# Patient Record
Sex: Female | Born: 1935 | Race: Black or African American | Hispanic: No | Smoking: Never smoker
Health system: Southern US, Community
[De-identification: ages and names within clinical notes are randomized; demographics above are authoritative.]

## PROBLEM LIST (undated history)

## (undated) DIAGNOSIS — I10 Essential (primary) hypertension: Secondary | ICD-10-CM

## (undated) DIAGNOSIS — E785 Hyperlipidemia, unspecified: Secondary | ICD-10-CM

## (undated) DIAGNOSIS — H409 Unspecified glaucoma: Secondary | ICD-10-CM

## (undated) DIAGNOSIS — M1990 Unspecified osteoarthritis, unspecified site: Secondary | ICD-10-CM

## (undated) HISTORY — DX: Unspecified osteoarthritis, unspecified site: M19.90

## (undated) HISTORY — DX: Unspecified glaucoma: H40.9

## (undated) HISTORY — DX: Essential (primary) hypertension: I10

## (undated) HISTORY — DX: Hyperlipidemia, unspecified: E78.5

---

## 2000-11-10 ENCOUNTER — Other Ambulatory Visit: Admission: RE | Admit: 2000-11-10 | Discharge: 2000-11-10 | Payer: Self-pay | Admitting: Obstetrics and Gynecology

## 2001-08-09 ENCOUNTER — Ambulatory Visit (HOSPITAL_COMMUNITY): Admission: RE | Admit: 2001-08-09 | Discharge: 2001-08-09 | Payer: Self-pay | Admitting: Cardiology

## 2001-08-09 ENCOUNTER — Encounter: Payer: Self-pay | Admitting: Cardiology

## 2001-10-14 ENCOUNTER — Emergency Department (HOSPITAL_COMMUNITY): Admission: EM | Admit: 2001-10-14 | Discharge: 2001-10-15 | Payer: Self-pay | Admitting: Emergency Medicine

## 2001-10-14 ENCOUNTER — Encounter: Payer: Self-pay | Admitting: Emergency Medicine

## 2001-11-02 ENCOUNTER — Ambulatory Visit (HOSPITAL_COMMUNITY): Admission: RE | Admit: 2001-11-02 | Discharge: 2001-11-02 | Payer: Self-pay | Admitting: Orthopedic Surgery

## 2001-11-12 ENCOUNTER — Encounter: Payer: Self-pay | Admitting: Orthopedic Surgery

## 2001-11-12 ENCOUNTER — Ambulatory Visit (HOSPITAL_COMMUNITY): Admission: RE | Admit: 2001-11-12 | Discharge: 2001-11-12 | Payer: Self-pay | Admitting: Orthopedic Surgery

## 2001-11-22 ENCOUNTER — Encounter: Admission: RE | Admit: 2001-11-22 | Discharge: 2002-02-20 | Payer: Self-pay | Admitting: Orthopedic Surgery

## 2002-01-23 ENCOUNTER — Encounter: Admission: RE | Admit: 2002-01-23 | Discharge: 2002-01-23 | Payer: Self-pay | Admitting: Cardiology

## 2002-01-23 ENCOUNTER — Encounter: Payer: Self-pay | Admitting: Cardiology

## 2004-01-06 ENCOUNTER — Ambulatory Visit: Payer: Self-pay | Admitting: Internal Medicine

## 2004-01-06 ENCOUNTER — Inpatient Hospital Stay (HOSPITAL_COMMUNITY): Admission: EM | Admit: 2004-01-06 | Discharge: 2004-01-16 | Payer: Self-pay | Admitting: Emergency Medicine

## 2004-01-07 ENCOUNTER — Encounter (INDEPENDENT_AMBULATORY_CARE_PROVIDER_SITE_OTHER): Payer: Self-pay | Admitting: *Deleted

## 2004-01-13 ENCOUNTER — Encounter (INDEPENDENT_AMBULATORY_CARE_PROVIDER_SITE_OTHER): Payer: Self-pay | Admitting: *Deleted

## 2004-03-05 ENCOUNTER — Encounter: Admission: RE | Admit: 2004-03-05 | Discharge: 2004-05-28 | Payer: Self-pay | Admitting: Internal Medicine

## 2004-04-07 ENCOUNTER — Encounter: Admission: RE | Admit: 2004-04-07 | Discharge: 2004-04-07 | Payer: Self-pay | Admitting: Cardiology

## 2004-09-22 ENCOUNTER — Ambulatory Visit (HOSPITAL_COMMUNITY): Admission: RE | Admit: 2004-09-22 | Discharge: 2004-09-22 | Payer: Self-pay | Admitting: Cardiology

## 2004-11-24 ENCOUNTER — Encounter: Admission: RE | Admit: 2004-11-24 | Discharge: 2004-11-24 | Payer: Self-pay | Admitting: Cardiology

## 2005-11-22 ENCOUNTER — Ambulatory Visit: Payer: Self-pay | Admitting: Internal Medicine

## 2005-12-08 ENCOUNTER — Ambulatory Visit: Payer: Self-pay | Admitting: Internal Medicine

## 2005-12-20 IMAGING — RF DG ESOPHAGUS
9 of 10 series · 20 of 22 positions shown · non-contrast
Comparison: none

CLINICAL DATA: Dysphagia.
 BARIUM SWALLOW:
 Only a single contrast barium swallow was performed.  The neuromuscular swallowing mechanism appears normal.  There is a prominent cricopharyngeus muscle noted.  Only minimal tertiary contractions are present.  There is a small sliding hiatal hernia but no reflux could be elicited.  Barium pill was given at the end of the study, which passed into the stomach without delay.

[Series 1: run · 1 of 1 slices shown (1 of 9)]
[im 1/1]
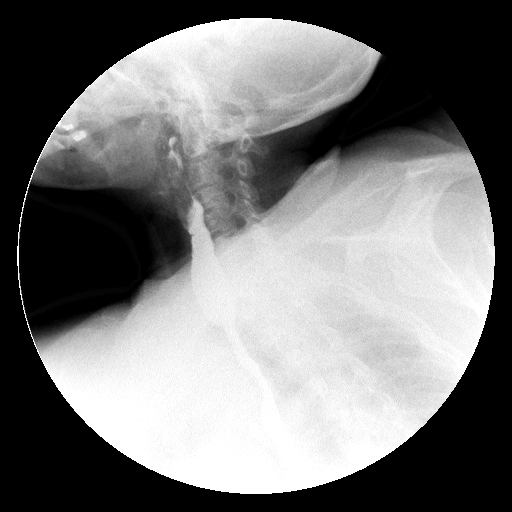

[Series 2: run · 1 of 1 slices shown (2 of 9)]
[im 1/1]
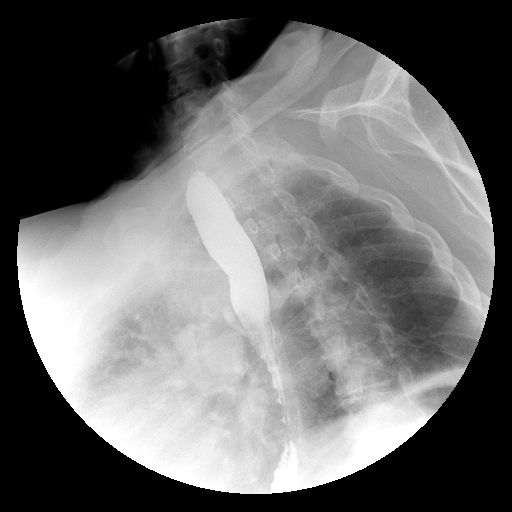

[Series 3: run · 1 of 1 slices shown (3 of 9)]
[im 1/1]
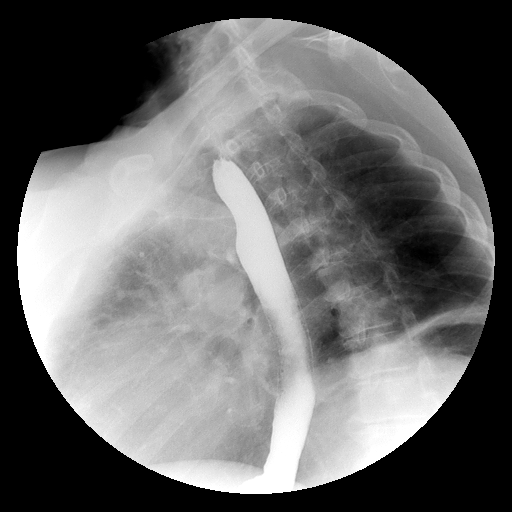

[Series 4: run · 1 of 1 slices shown (4 of 9)]
[im 1/1]
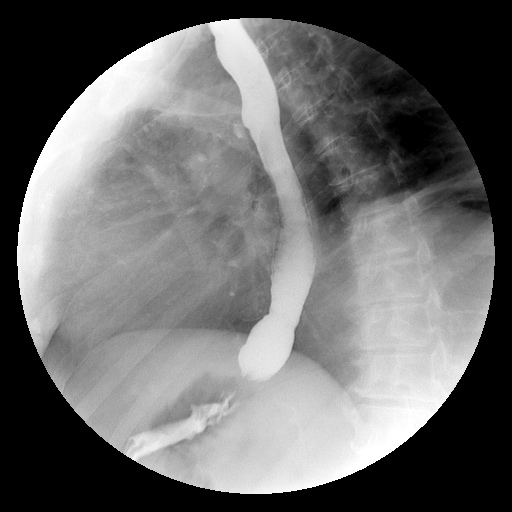

[Series 6: run · 1 of 1 slices shown (5 of 9)]
[im 1/1]
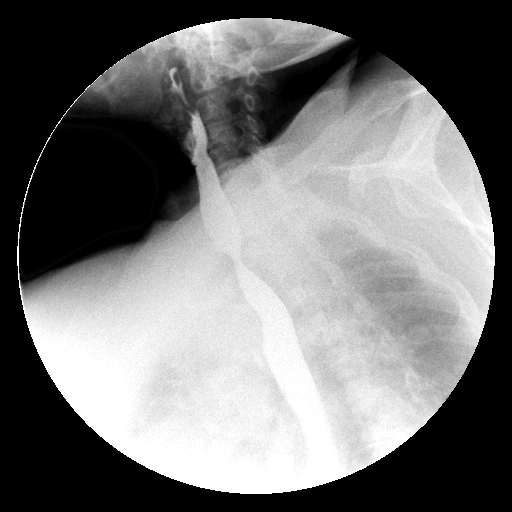

[Series 8: run · 1 of 1 slices shown (6 of 9)]
[im 1/1]
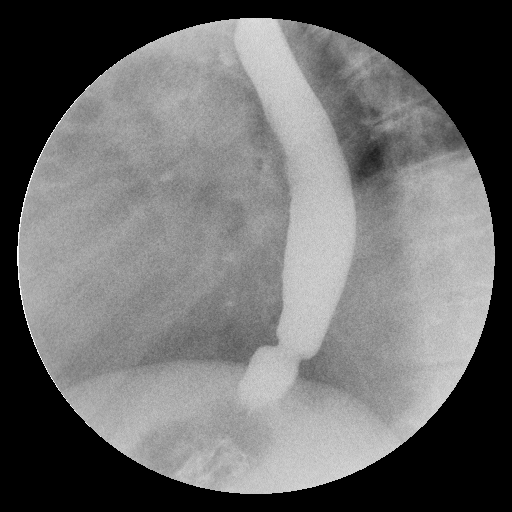

[Series 9: run · 1 of 1 slices shown (7 of 9)]
[im 1/1]
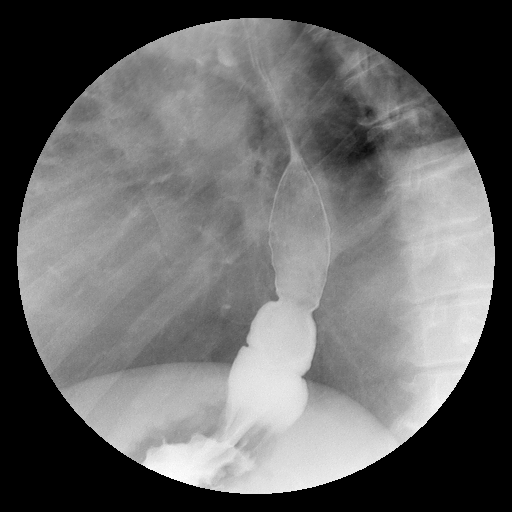

[Series 10: run · 8 of 9 slices shown (8 of 9)]
[im 1/9]
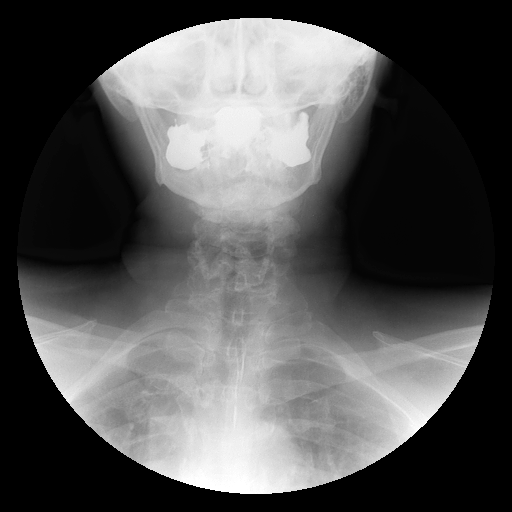
[im 2/9]
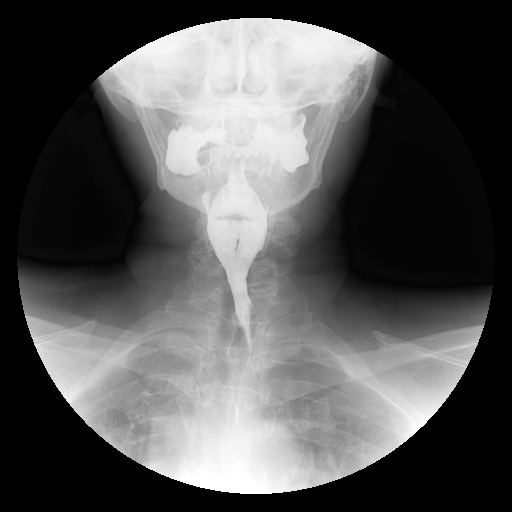
[im 3/9]
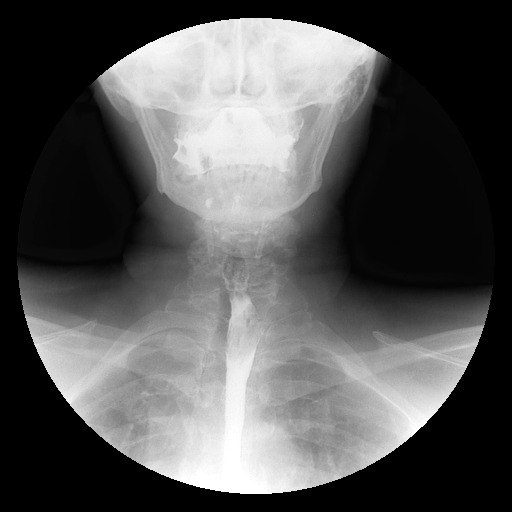
[im 4/9]
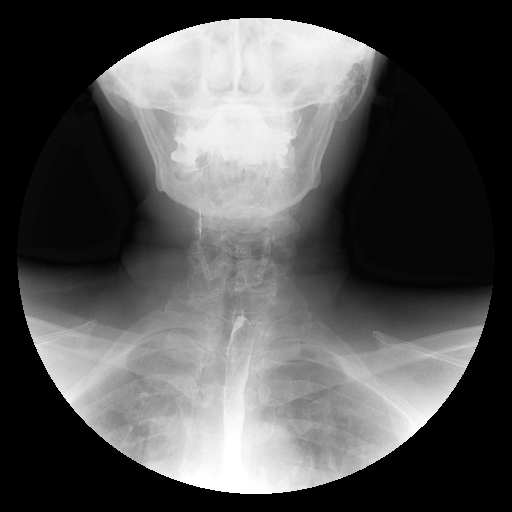
[im 5/9]
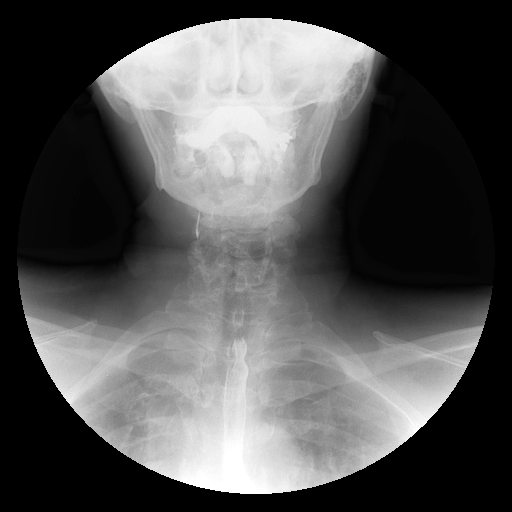
[im 6/9]
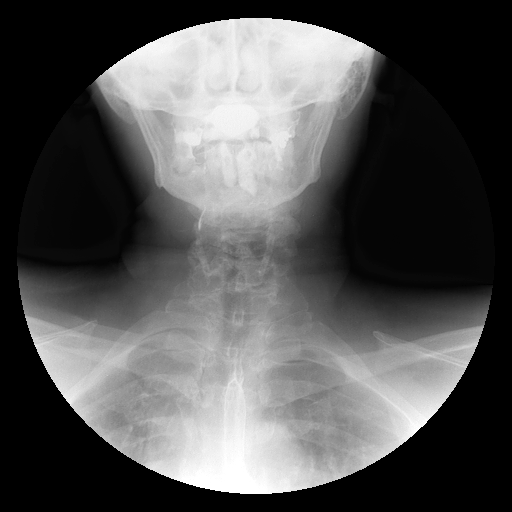
[im 7/9]
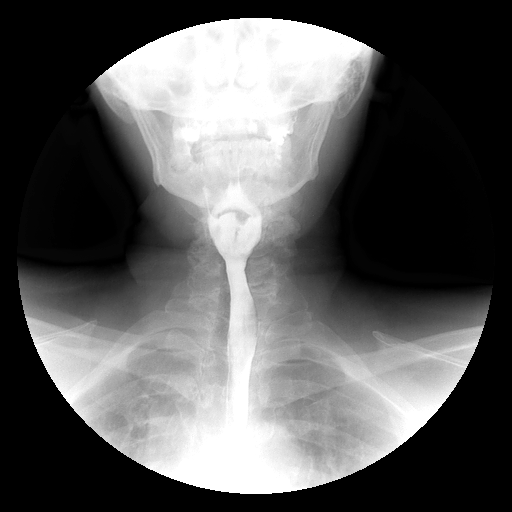
[im 8/9]
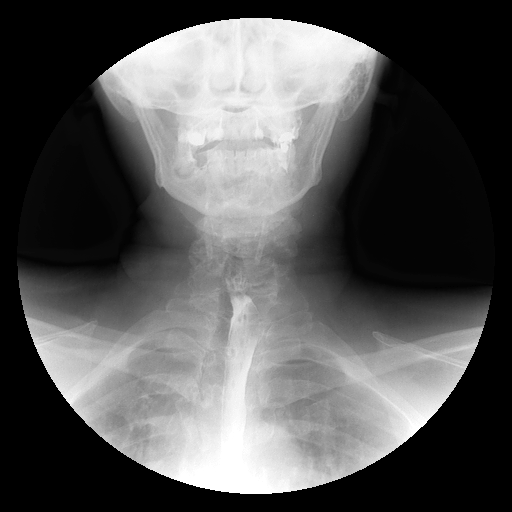

[Series 11: run · 5 of 5 slices shown (9 of 9)]
[im 1/5]
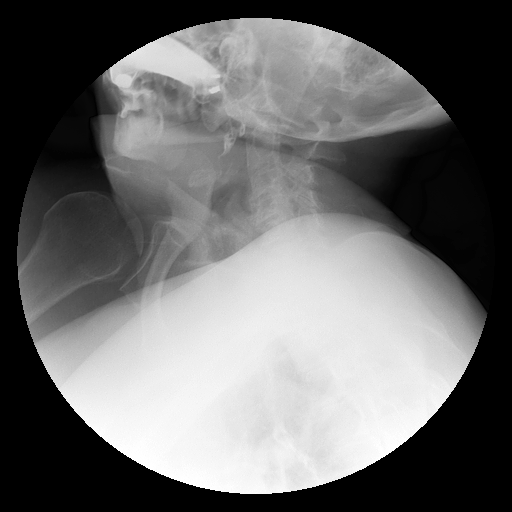
[im 2/5]
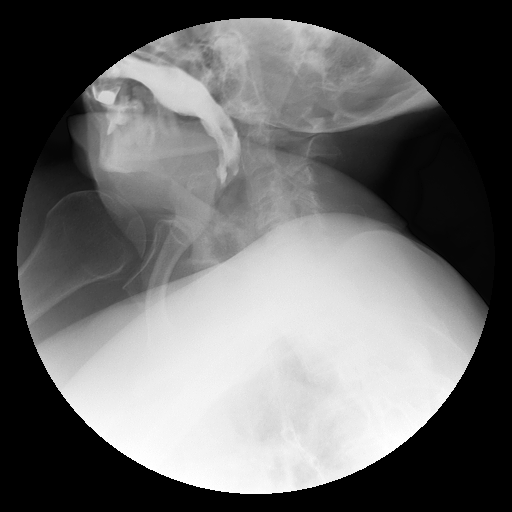
[im 3/5]
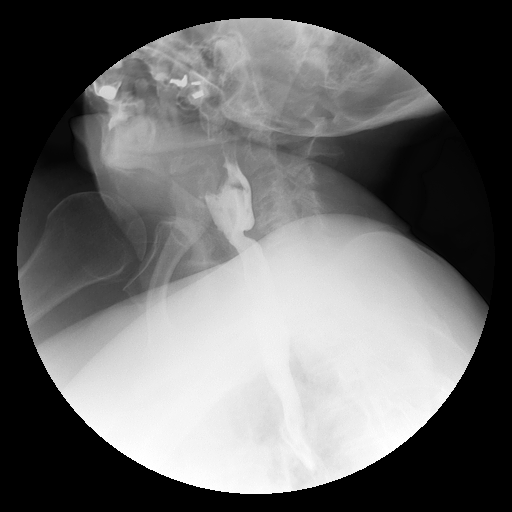
[im 4/5]
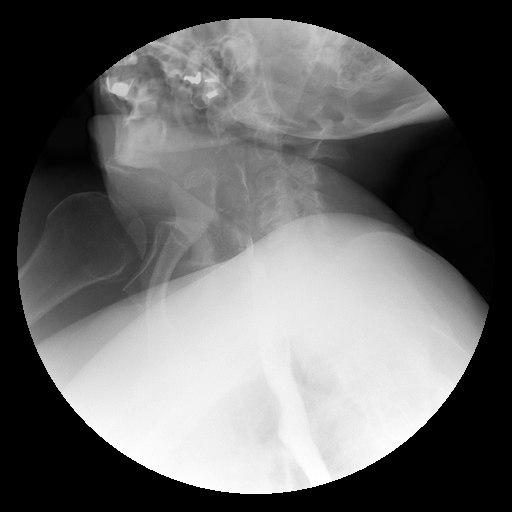
[im 5/5]
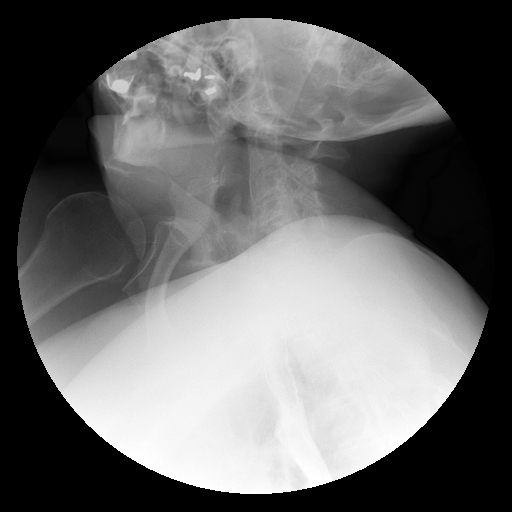

[20 of 22 positions shown; findings below may reference images not displayed]

IMPRESSION: 1.  Small sliding hiatal hernia.  No definite reflux.  Prominent cricopharyngeus muscle.
 2.  Barium pill passes into stomach without delay.

## 2007-05-23 ENCOUNTER — Encounter: Admission: RE | Admit: 2007-05-23 | Discharge: 2007-05-23 | Payer: Self-pay | Admitting: Orthopedic Surgery

## 2007-10-25 ENCOUNTER — Ambulatory Visit (HOSPITAL_COMMUNITY): Admission: RE | Admit: 2007-10-25 | Discharge: 2007-10-25 | Payer: Self-pay | Admitting: Cardiology

## 2008-04-17 ENCOUNTER — Ambulatory Visit (HOSPITAL_COMMUNITY): Admission: RE | Admit: 2008-04-17 | Discharge: 2008-04-17 | Payer: Self-pay | Admitting: Cardiology

## 2008-04-17 ENCOUNTER — Encounter (INDEPENDENT_AMBULATORY_CARE_PROVIDER_SITE_OTHER): Payer: Self-pay | Admitting: Cardiology

## 2008-04-17 ENCOUNTER — Ambulatory Visit: Payer: Self-pay | Admitting: Cardiology

## 2008-09-06 ENCOUNTER — Ambulatory Visit (HOSPITAL_COMMUNITY): Admission: RE | Admit: 2008-09-06 | Discharge: 2008-09-06 | Payer: Self-pay | Admitting: Cardiology

## 2010-02-15 ENCOUNTER — Encounter: Payer: Self-pay | Admitting: Cardiology

## 2010-06-12 NOTE — Consult Note (Signed)
NAMETOVA, VATER NO.:  [id number]   MEDICAL RECORD NO.:  [id number]          PATIENT TYPE:  INP   LOCATION:  4715                         FACILITY:  [facility]   PHYSICIAN:  Wilber Bihari. Caryn Section, M.D.   DATE OF BIRTH:  1929/06/08   DATE OF CONSULTATION:  01/08/2004  DATE OF DISCHARGE:                                   CONSULTATION   Sheila Price is a very nice 77 year old black woman admitted on January 06, 2004, with shortness of breath and right-sided chest pain. Pleural effusions  and pulmonary nodules were noted chest x-ray. CT scan of the chest was done  with contrast on January 06, 2004, at which time serum creatinine was 1.5.  She had been on NSAID (Mobic), A-II blocker (Diovan), diuretic (HCTZ). The  creatinine has risen progressively (4.8 mg/dl today) and renal consultation  was requested. She is currently being treated with Catapres TTS-2 patch once  a week, Zocor 40/D, Toprol 100/D, Norvasc, A-II blocker, NSAID, and HCTZ  have all been discontinued.   PAST MEDICAL HISTORY:  Hypertension, bilateral knee arthroscopy.   ALLERGIES:  None.   REVIEW OF SYSTEMS:  Positive for edema and right-sided chest pain. No  angina, claudication, hemoptysis, hematemesis, melena, hematochezia. No  renal colic. No hematuria. No loss of consciousness. No seizures. No weight  loss. No cold intolerance.   SOCIAL HISTORY:  She was born in [place]. She graduated from high school.  She attended two years at [employer] (studying elementary education).  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. She was  married for 25 years and is a widow. She lives alone. She does not smoke  cigarettes or drink alcohol.   FAMILY HISTORY:  Her father died of ? soon after her birth. Her mother died  in her 76s of old age and high blood pressure. She has three children all of  whom have high blood pressure. They are otherwise well.   PHYSICAL EXAMINATION:  GENERAL:  She is awake, alert.  VITAL SIGNS: Temperature 99, pulse 82, respirations 24, blood pressure  115/70.  HEENT: Moist mucous membranes. No abscess noted in the mouth.  NECK: Carotids +5 without bruits. Not stiff.  CHEST: Decreased breath sounds in the right base.  HEART: No rub.  ABDOMEN: Nontender. Bowel sounds present. No bruits are heard.  EXTREMITIES: No edema.   LABORATORY DATA:  On January 06, 2004, creatinine 1.5; on December 13th  4.3; on December 14th 4.8. Sodium 136, potassium 4.3, chloride 101, CO2 23,  BUN 58. Urinalysis done on December 12th, SG1042, 2+ protein, negative  blood, negative LE, negative NIT, 3-6 wbc's, 0-2 rbc's, occasional hyaline  cast. Hemoglobin 12.4, hematocrit 37.1, WBC 23,600, platelet count 324,000.  Pleural fluid looks exudative (high LDH), cytologies are pending. CT of  abdomen and pelvis do not show any evidence of urinary tract obstruction.   IMPRESSION:  Acute renal failure. Probable contrast nephrotoxicity.   PLAN:  Continue IV fluids for another 24 hours, avoid nephrotoxins, S-PEP  and U-PEP (to be sure no myeloma  is present--doubt she has this also).  No  indications for dialysis at this point.       RFF/MEDQ  D:  01/08/2004  T:  01/08/2004  Job:  [id number]

## 2010-06-12 NOTE — Discharge Summary (Signed)
NAMEAMERY, VANDENBOS NO.:  [id number]   MEDICAL RECORD NO.:  [id number]          PATIENT TYPE:  INP   LOCATION:  4715                         FACILITY:  [facility]   PHYSICIAN:  Osvaldo Shipper. Spruill, M.D.DATE OF BIRTH:  Sep 02, 1929   DATE OF ADMISSION:  01/06/2004  DATE OF DISCHARGE:  01/16/2004                                 DISCHARGE SUMMARY   ADMISSION OR PROVISIONAL DIAGNOSES:  1.  Right pleural effusion.  2.  Severe hypertension.  3.  Questionable diabetes.  4.  Obesity.  5.  Dyslipidemia.  6.  Possible pneumonia.   DISCHARGE DIAGNOSES:  1.  Pneumonia with right pleural effusion.  2.  Lung mass, benign.  3.  Dyslipidemia.  4.  Contrast-induced interstitial nephritis with renal insufficiency.  5.  Obesity.  6.  Hypertension.  7.  Possible left ventricular dysfunction.  8.  Chronic dyspnea.   REASON FOR ADMISSION:  This 77 year old white woman with a history of  hypertension and dyslipidemia presents to the emergency room complaining of  severe dyspnea.  The patient also complained of some severe right-sided  chest pain that had began Friday prior to January 06, 2004.  The patient  sought medical attention at the emergency room, she was evaluated and found  to have evidence of right pleural effusion and possible pneumonia.  Due to  this with complaints of severe chest pain which sounded pleuritic in nature,  she was subsequently admitted to the hospital for further evaluation and  treatment.  The patient's laboratory studies revealed the following:  On  January 09, 2004 white blood cell count was 21,000, hemoglobin 11.5,  hematocrit 33.6.  On December 20 the white blood cell count was 14,600,  hemoglobin 10.4, hematocrit 30.4.  Serum sodium on admission was 133,  potassium 3.6, chloride 100, CO2 content 21, BUN was recorded at 140,  creatinine 59, had decreased to a BUN of 32 and a creatinine of 1.3 at the  time of discharge.   The patient's arterial  blood gases performed on December 17 revealed a pH of  7.416, a pCO2 of 37.1 and a pO2 of 63.5.  The patient's CMET revealed  elevated alkaline phosphatase of 270, SGOT of 70 and SGPT of 76.  The  patient also had a protein electrophoresis which revealed serum albumin was  39.0, alpha-1 13.2, alpha-2 19.6, beta-1 4.9, beta-2 9.0 and gamma globulin  was 14.3.  Additional laboratory results also included admission white blood  cell count 16,500, sedimentation rate on December 12 was 70.  The patient's  initial BUN and creatinine on December 12 was BUN 30, creatinine 1.5,  glucose was 161.  The patient had a biopsy of a mass that was in the left  lung, revealed scattered atypical cell groups in a background of marked  acute inflammation.  No evidence of tumor.  The patient had some fluid  aspirated from her lung on December 13 and was classified as pleural fluid.  It was turbid with increased white cells and increased neutrophil count and  low macrophage.  Blood cultures obtained from the  right hand revealed no  growth after five days and the body fluid aspirate revealed no growth after  three days.   Urinalysis revealed color was yellow, clear.  The specific gravity was  1.040, glucose was negative, pH was 5.5.  The protein in the urine was 100  and hyaline casts were also appreciated.   RADIOLOGIC STUDIES:  The patient initially had a CT scan of the chest with  contrast performed on January 06, 2004.  It revealed several lung nodules,  bilaterally worsened metastatic involvement of the lungs.  A CT scan of the  abdomen and pelvis were requested.  A small right pleural effusion, the  right lower lobe atelectasis and/or pneumonia was noted.  A single prominent  pre carinal node was also noted.  Chest x-ray performed on January 06, 2004  revealed atelectasis, right greater than left, probable small right pleural  effusion.  Ultrasound-guided thoracentesis revealed 60 cc of turbid yellow   fluid.   CT scan of the abdomen performed on January 08, 2004 revealed collapse or  consolidation of both lung bases, right greater than left, associated with  right effusion.  Enhancement of the kidneys two days after contrast bolus  suggested the component of renal dysfunction and a cluster of low density  lesions in the inferior pole of the right kidney thought to be cyst.   Chest x-ray revealed cardiomegaly, mild edema, bilateral basilar atelectasis  was noted.  Chest x-ray performed on December 15 revealed that a PICC line  had been inserted to the proximal superior vena cava.  On December 17,  marginal worsening of air space disease in the right lung.  January 12, 2004 chest x-ray:  Diminished aeration with increase in basilar atelectasis,  no change in the PICC line.  CT-guided aspiration of the right upper lobe  lung lesion was performed on January 13, 2004.   Electrocardiograms revealed a normal sinus rhythm, heart rate was 76, left  ventricular hypertrophy was noted on this particular electrocardiogram.  The  electrocardiographic monitoring revealed the patient had a reported 2.1 sec  pulse and at this point the beta blocker was to continue.   CONSULTATIONS:  1.  Richard F. Caryn Section, M.D. - Nephrologist for renal insufficiency.  2.  __________, M.D. - Lung physician.   HOSPITAL COURSE:  This pleasant 77 year old white woman with hypertension  and obesity and dyslipidemia presented to the hospital emergency room  complaining of severe right chest discomfort.  The patient was evaluated in  the emergency room and found to have a diagnosis of pleural effusion and  some possible evidence of lung metastasis.  Base line laboratory studies  were obtained as well as a BMP.  A TB skin test was applied.  Medications  were started which included Norvasc, Lipitor, a multivitamin, Toprol, Diovan were administered to the patient.  She was placed on a 2 g sodium restricted  diet.  Blood  gases were obtained that did reveal hypoxemia, likely secondary  to a bibasilar atelectasis and a right pleural effusion.   A consultation was sent to __________ pulmonary.  An ultrasound-guided  thoracentesis was performed and revealed thick turbid fluid; however, the  culture on this fluid was unremarkable.   The patient continued to complain of severe discomfort and on December 13  she was started on Dilaudid PCA pump.  She was given further pulmonary  toilet with Combivent and also given support with Lovenox to avoid deep  venous thrombosis.   The patient  did poorly until she was actually started on intravenous  antibiotics of Zosyn and revealed that she indeed had a pneumonia.  Initially after the patient was eventually admitted did not reveal evidence  of pneumonia.  She developed symptoms while hospitalized.  She also  developed a fever and increase in white count while hospitalized.   She was started on Zosyn 4.5 g IV every 12 hours on January 09, 2004 and  from that point on began to improve.  There is a note that she had increased  white blood cell count, as well as fever and these were seen to resolve with  the addition of Zosyn.   The patient unfortunately developed extreme weakness due to her lengthy  hospitalization and will require further rehab prior to her release back  home.  She was started on Augmentin 875 mg twice a day, was started on  January 15, 2004.  Her metoprolol was discontinued due to difficulty with  brady dysrhythmia, as well as sinus pause/sinus arrest.   She will be transferred to a skilled nursing facility for physical therapy  at this time.  Special instructions at discharge will include:  Maintaining  the patient's PICC line using normal saline flush, 10 cc every 12 hours.  She will be released on the following medications of:  1.  Clonidine 0.2 mg patch to chest weekly.  2.  Zocor 40 mg p.o. daily.  3.  Multivitamins one p.o. daily.  4.   Ventolin inhaler 2.5 cc via inhaler q.i.d.  5.  Atrovent 0.5 mg via the hand-held inhaler four times a day.  6.  She will also be prescribed Augmentin 875 mg p.o. b.i.d.  7.  Comfort medications which will include pain medications and the like.   The patient's discharge diet will include a 2 g sodium restricted  low  carbohydrate diet.  She will also require physical therapy at the skilled  nursing facility for ambulation and endurance training.   Overall, the patient's condition has improved.  She was admitted initially  for right pleural effusion, found to have evidence of pneumonia.  She  appears to have responded to the Zosyn therapy and has improved but not to  the point of self independence.  She will be released to a skilled nursing  facility and will have further office followup in approximately two to three  weeks.      Laurence Slate   JOS/MEDQ  D:  01/16/2004  T:  01/16/2004  Job:  [id number]

## 2010-06-12 NOTE — Assessment & Plan Note (Signed)
[facility]                               PULMONARY OFFICE NOTE   NAME:Sheila Price, Sheila Price                        MRN:          [id number]  DATE:11/22/2005                            DOB:          22-Apr-1929    PULMONARY CONSULTATION   CHIEF COMPLAINT:  Dyspnea.   HISTORY:  A 77 year old white female with progressive dyspnea for over a  year to the point to where she is struggling to do any of her housework and  also has to lean on the cart at the grocery store.  She denies any  associated exertional chest pain variability with weather changes, coughing,  or wheezing.  She does report some improvement after using albuterol.   She comes in today actually having not used any albuterol, walking with a  cane with no difficulty ambulating around the office.  She denies any  orthopnea, PND, or significant leg swelling over baseline, exertional or  pleuritic chest pain, overt sinus or reflux symptoms.   PAST MEDICAL HISTORY:  Significant for:  1. Obesity (she never actually answered the question about her baseline      weight).  2. Hyperlipidemia.  3. Hypertension.  4. DJD.   ALLERGIES:  Taken in detail on the medication sheet, dated November 22, 2005.   SOCIAL HISTORY:  She has never smoked.  She denies any unusual travel, pet,  hobby, or occupational exposure.   FAMILY HISTORY:  Negative for respiratory diseases.   REVIEW OF SYSTEMS:  Taken in detail and only significant for the problems  outlined above.   PHYSICAL EXAMINATION:  An obese, black female in no acute distress, weighing  227 with a blood pressure of 170/92 after failing to take her medicine this  morning.  HEENT:  Unremarkable.  Oropharynx is clear.  NECK:  Supple without cervical adenopathy or tenderness.  Trachea is  midline.  No thyromegaly.  Lung fields perfectly clear bilaterally to auscultation and percussion,  although breath sounds are a bit diminished.  HEART:  Regular  rhythm without murmur, gallop, or rub.  ABDOMEN:  Obese, benign with no palpable organomegaly, mass, or tenderness.  EXTREMITIES:  Warm without calf tenderness, cyanosis, clubbing, or edema.   Chest x-ray and lab work are pending.  I reviewed Dr. Magda Kiel notes,  indicating that she had a normal CBC.  Chemistry profile (bicarb 25) is on  November 03, 2005, and also the report of a normal Cardiolite recently.   IMPRESSION:  Morbid obesity and hypertension are the only obvious issues  today.  I suspect this patient is maybe nonadherent and may have significant  diastolic dysfunction related to poorly controlled hypertension.  I have  advised her always to take a blood pressure medicine before she comes to the  doctor's office but, in the future, I would like her not to use albuterol  before she comes in (in fact, I would only like her to use the albuterol on  a p.r.n. basis) so that we can fully assess whether or not there is a  reversible air flow component.   For  now, I recommended empiric treatment for reflux with Protonix 40 mg  taken daily (samples only) along with diet in case there is a component of  pseudoasthma present that previously responded to albuterol and see her  back in 2 weeks to review her blood pressure again as well as the lab work  done today which included a BMP, sed rate, and D-dimer, looking for any  evidence for occult DVT, pulmonary embolus, __________    ______________________________  Charlaine Dalton. Sherene Sires, MD, [facility]    MBW/MedQ  DD: 11/22/2005  DT: 11/22/2005  Job #: [id number]   cc:   Osvaldo Shipper. Spruill, M.D.

## 2010-06-12 NOTE — Discharge Summary (Signed)
NAMELYNDIA, BURY NO.:  [id number]   MEDICAL RECORD NO.:  [id number]          PATIENT TYPE:  INP   LOCATION:  4715                         FACILITY:  [facility]   PHYSICIAN:  Osvaldo Shipper. Spruill, M.D.DATE OF BIRTH:  15-Jan-1930   DATE OF ADMISSION:  01/06/2004  DATE OF DISCHARGE:                                 DISCHARGE SUMMARY   ADDENDUM TO DISCHARGE SUMMARY:  Ms. Sheila Price medications at discharge  will include the above medication plus:  1.  Clonidine 0.2 mg weekly.  2.  Zocor 40 mg p.o. daily.  3.  Multivitamins p.o. daily.  4.  Ventolin inhaler as well as the Atrovent inhaler.  5.  She will require the Augmentin 875 mg p.o. b.i.d. for at least 7 days.   She will require comfort medicines of Tylenol and she will likely need  AlternaGEL and no Milk of Magnesia at this point due to her tendency for  renal insufficiency.  Her discharge diet should be a low calorie, low fat,  1800 calorie diet.  She will be followed by me after her release from the skilled nursing  facility.      Laurence Slate   JOS/MEDQ  D:  01/16/2004  T:  01/16/2004  Job:  [id number]

## 2010-06-12 NOTE — Assessment & Plan Note (Signed)
[facility]                               PULMONARY OFFICE NOTE   NAME:Sheila Price, Sheila Price                        MRN:          [id number]  DATE:12/08/2005                            DOB:          28-Jun-1929    HISTORY:  This is a 77 year old black female with morbid obesity and dyspnea  with exertion that did not seem any better from albuterol, for which I  suspected she might have a component of upper airway dysfunction.  I  recommended switching over to Protonix from albuterol and subsequently she  returns for follow-up, stating she is no better, no worse.   MEDICATIONS:  For full list of medications please see face sheet, dated  December 08, 2005.   PHYSICAL EXAMINATION:  GENERAL:  She is an obese black female in no acute  distress.  VITAL SIGNS:  Stable vital signs.  HEENT:  Unremarkable.  LUNGS:  Lung fields are clear bilaterally to auscultation and percussion.  HEART:  Regular rhythm without murmur, gallop, or rub.  ABDOMEN:  Soft, benign.  EXTREMITIES:  Warm without calf tenderness, cyanosis, or clubbing.   LABORATORY DATA:  Included a chest x-ray showing decreased lung volumes on  November 22, 2005, a normal D-dimer on the same date along with BNP and  sedimentation rate.  PFTs subsequently were found to be normal on October 29  except for a disproportionate reduction noted to be consistent with obesity.   We walked her around the office for several laps and did not see any  desaturation.  She said her knees gave out before she became short of  breath.   IMPRESSION:  This patient mainly suffers from obesity with deconditioning  and is not limited by any obvious pulmonary mechanism.  I am concerned about  the complexity of her care and the fact that she may be nonadherent,  especially regarding diet and medications directed at blood pressure, but  note for the record that her blood pressure is better today.  Her blood  pressure was 140/80  after actually taking her blood pressure medicines this  morning (on her last visit she failed to take her medicines before the  visit, as they are listed in the column dated December 08, 2005).   From my perspective she needs no pulmonary medicines and may not even need  to be treated for reflux.  On a reverse therapeutic trial I have asked her  to stop the Protonix to see if it makes any difference at all, and if not,  to continue off of it.   Pulmonary follow-up can be p.r.n.     Charlaine Dalton. Sherene Sires, MD, [facility]  Electronically Signed    MBW/MedQ  DD: 12/08/2005  DT: 12/08/2005  Job #: [id number]   cc:   Osvaldo Shipper. Spruill, M.D.

## 2012-08-28 ENCOUNTER — Ambulatory Visit
Admission: RE | Admit: 2012-08-28 | Discharge: 2012-08-28 | Disposition: A | Discharge status: Home / Self Care | Payer: Medicare Other | Source: Ambulatory Visit | Attending: Orthopedic Surgery | Admitting: Orthopedic Surgery

## 2012-08-28 ENCOUNTER — Other Ambulatory Visit: Payer: Self-pay | Admitting: Orthopedic Surgery

## 2012-08-28 DIAGNOSIS — M1990 Unspecified osteoarthritis, unspecified site: Secondary | ICD-10-CM

## 2012-09-01 ENCOUNTER — Other Ambulatory Visit: Payer: Self-pay | Admitting: Orthopedic Surgery

## 2012-09-08 ENCOUNTER — Other Ambulatory Visit (HOSPITAL_COMMUNITY): Payer: Self-pay | Admitting: Cardiology

## 2012-09-08 DIAGNOSIS — R079 Chest pain, unspecified: Secondary | ICD-10-CM

## 2012-09-22 ENCOUNTER — Encounter (HOSPITAL_COMMUNITY): Payer: Medicare Other

## 2012-09-29 ENCOUNTER — Encounter (HOSPITAL_COMMUNITY): Payer: Medicare Other

## 2012-10-13 ENCOUNTER — Encounter (HOSPITAL_COMMUNITY): Payer: Medicare Other

## 2012-11-22 MED ORDER — CEFAZOLIN SODIUM-DEXTROSE 2-3 GM-% IV SOLR: 2.0000 g | INTRAVENOUS | Status: DC

## 2012-11-23 ENCOUNTER — Inpatient Hospital Stay (HOSPITAL_COMMUNITY): Admission: RE | Admit: 2012-11-23 | Payer: Medicare Other | Source: Ambulatory Visit | Admitting: Orthopedic Surgery

## 2012-11-23 ENCOUNTER — Encounter (HOSPITAL_COMMUNITY): Admission: RE | Payer: Self-pay | Source: Ambulatory Visit

## 2012-11-23 SURGERY — ARTHROPLASTY, KNEE, TOTAL
Anesthesia: General | Laterality: Left

## 2013-03-15 ENCOUNTER — Ambulatory Visit: Payer: Self-pay | Admitting: Podiatrist

## 2013-03-22 ENCOUNTER — Ambulatory Visit: Payer: Self-pay | Admitting: Podiatrist

## 2013-03-29 ENCOUNTER — Ambulatory Visit: Payer: Self-pay | Admitting: Podiatrist

## 2013-04-12 ENCOUNTER — Encounter: Payer: Self-pay | Admitting: Podiatrist

## 2013-04-12 ENCOUNTER — Ambulatory Visit (INDEPENDENT_AMBULATORY_CARE_PROVIDER_SITE_OTHER): Payer: Medicare Other | Admitting: Podiatrist

## 2013-04-12 VITALS — BP 153/76 | HR 59 | Resp 18

## 2013-04-12 DIAGNOSIS — M79609 Pain in unspecified limb: Secondary | ICD-10-CM

## 2013-04-12 DIAGNOSIS — B351 Tinea unguium: Secondary | ICD-10-CM

## 2013-04-12 NOTE — Progress Notes (Signed)
I am here to get my toenails trimmed up HPI:  Patient presents today for follow up of foot and nail care. Denies any new complaints today.  Objective:  Patients chart is reviewed.  Vascular status reveals pedal pulses noted at 1 out of 4 dp and pt bilateral .  Neurological sensation is Normal to Semmes Weinstein monofilament bilateral.  Patients nails are thickened, discolored, distrophic, friable and brittle with yellow-brown discoloration. Patient subjectively relates they are painful with shoes and with ambulation of bilateral feet.  Assessment:  Symptomatic onychomycosis  Plan:  Discussed treatment options and alternatives.  The symptomatic toenails were debrided through manual an mechanical means without complication.  Return appointment recommended at routine intervals of 3 months    Jerrold Haskell, DPM   

## 2013-07-12 ENCOUNTER — Ambulatory Visit: Payer: Medicare Other | Admitting: Podiatrist

## 2013-07-26 ENCOUNTER — Ambulatory Visit (INDEPENDENT_AMBULATORY_CARE_PROVIDER_SITE_OTHER): Payer: Medicare Other | Admitting: Podiatrist

## 2013-07-26 ENCOUNTER — Encounter: Payer: Self-pay | Admitting: Podiatrist

## 2013-07-26 DIAGNOSIS — M79673 Pain in unspecified foot: Secondary | ICD-10-CM

## 2013-07-26 DIAGNOSIS — M79609 Pain in unspecified limb: Secondary | ICD-10-CM

## 2013-07-26 DIAGNOSIS — B351 Tinea unguium: Secondary | ICD-10-CM

## 2013-07-26 NOTE — Progress Notes (Signed)
HPI:  Patient presents today for follow up of foot and nail care. Denies any new complaints today.  Objective:  Patients chart is reviewed.  Vascular status reveals pedal pulses noted at 1 out of 4 dp and pt bilateral .  Neurological sensation is Normal to Semmes Weinstein monofilament bilateral.  Patients nails are thickened, discolored, distrophic, friable and brittle with yellow-brown discoloration. Patient subjectively relates they are painful with shoes and with ambulation of bilateral feet.  Assessment:  Symptomatic onychomycosis  Plan:  Discussed treatment options and alternatives.  The symptomatic toenails were debrided through manual an mechanical means without complication.  Return appointment recommended at routine intervals of 3 months    Dima Ferrufino, DPM   

## 2013-10-25 ENCOUNTER — Ambulatory Visit: Payer: Medicare Other | Admitting: Podiatrist

## 2013-11-08 ENCOUNTER — Encounter: Payer: Self-pay | Admitting: Podiatrist

## 2013-11-08 ENCOUNTER — Ambulatory Visit (INDEPENDENT_AMBULATORY_CARE_PROVIDER_SITE_OTHER): Payer: Medicare Other | Admitting: Podiatrist

## 2013-11-08 DIAGNOSIS — M79676 Pain in unspecified toe(s): Secondary | ICD-10-CM

## 2013-11-08 DIAGNOSIS — B351 Tinea unguium: Secondary | ICD-10-CM

## 2013-11-08 NOTE — Progress Notes (Signed)
HPI:  Patient presents today for follow up of foot and nail care. Denies any new complaints today.  Objective:  Patients chart is reviewed.  Vascular status reveals pedal pulses noted at 1 out of 4 dp and pt bilateral .  Neurological sensation is Normal to Semmes Weinstein monofilament bilateral.  Patients nails are thickened, discolored, distrophic, friable and brittle with yellow-brown discoloration. Patient subjectively relates they are painful with shoes and with ambulation of bilateral feet.  Assessment:  Symptomatic onychomycosis  Plan:  Discussed treatment options and alternatives.  The symptomatic toenails were debrided through manual an mechanical means without complication.  Return appointment recommended at routine intervals of 3 months    Yussuf Sawyers, DPM   

## 2013-11-09 ENCOUNTER — Other Ambulatory Visit (HOSPITAL_COMMUNITY): Payer: Self-pay | Admitting: Cardiology

## 2013-11-09 DIAGNOSIS — R079 Chest pain, unspecified: Secondary | ICD-10-CM

## 2013-11-23 ENCOUNTER — Encounter (HOSPITAL_COMMUNITY)
Admission: RE | Admit: 2013-11-23 | Discharge: 2013-11-23 | Disposition: A | Discharge status: Home / Self Care | Payer: Medicare Other | Source: Ambulatory Visit | Attending: Cardiology | Admitting: Cardiology

## 2013-11-23 ENCOUNTER — Other Ambulatory Visit: Payer: Self-pay

## 2013-11-23 DIAGNOSIS — R079 Chest pain, unspecified: Secondary | ICD-10-CM | POA: Insufficient documentation

## 2013-11-23 MED ORDER — TECHNETIUM TC 99M SESTAMIBI GENERIC - CARDIOLITE
10.0000 | Freq: Once | INTRAVENOUS | Status: AC | PRN
  Administered 2013-11-23: 10 via INTRAVENOUS

## 2013-11-23 MED ORDER — TECHNETIUM TC 99M SESTAMIBI GENERIC - CARDIOLITE
30.0000 | Freq: Once | INTRAVENOUS | Status: AC | PRN
  Administered 2013-11-23: 30 via INTRAVENOUS

## 2013-11-23 MED ORDER — REGADENOSON 0.4 MG/5ML IV SOLN: 0.4000 mg | Freq: Once | INTRAVENOUS | Status: DC

## 2013-11-23 MED ORDER — REGADENOSON 0.4 MG/5ML IV SOLN
INTRAVENOUS | Status: AC
  Administered 2013-11-23: 0.4 mg
  Filled 2013-11-23: qty 5

## 2014-02-14 ENCOUNTER — Ambulatory Visit: Payer: Medicare Other | Admitting: Podiatrist

## 2014-04-12 ENCOUNTER — Ambulatory Visit
Admission: RE | Admit: 2014-04-12 | Discharge: 2014-04-12 | Disposition: A | Discharge status: Home / Self Care | Payer: Medicare Other | Source: Ambulatory Visit | Attending: Orthopedic Surgery | Admitting: Orthopedic Surgery

## 2014-04-12 ENCOUNTER — Other Ambulatory Visit: Payer: Self-pay | Admitting: Orthopedic Surgery

## 2014-04-12 DIAGNOSIS — M170 Bilateral primary osteoarthritis of knee: Secondary | ICD-10-CM

## 2014-12-27 ENCOUNTER — Encounter: Payer: Self-pay | Admitting: Podiatry

## 2014-12-27 ENCOUNTER — Ambulatory Visit (INDEPENDENT_AMBULATORY_CARE_PROVIDER_SITE_OTHER): Payer: Medicare Other | Admitting: Podiatry

## 2014-12-27 DIAGNOSIS — M79676 Pain in unspecified toe(s): Secondary | ICD-10-CM

## 2014-12-27 DIAGNOSIS — B351 Tinea unguium: Secondary | ICD-10-CM | POA: Diagnosis not present

## 2015-01-02 NOTE — Progress Notes (Signed)
Patient ID: Sheila Price, female   DOB: [birth year], 81 y.o.   MRN: [id number]  Subjective: 81 y.o. returns the office today for painful, elongated, thickened toenails which she cannot trim herself. Denies any redness or drainage around the nails. Denies any acute changes since last appointment and no new complaints today. Denies any systemic complaints such as fevers, chills, nausea, vomiting.   Objective: AAO 3, NAD DP/PT pulses palpable, CRT less than 3 seconds Protective sensation intact with Simms Weinstein monofilament Nails hypertrophic, dystrophic, elongated, brittle, discolored 10. There is tenderness overlying the nails 1-5 bilaterally. There is no surrounding erythema or drainage along the nail sites. No open lesions or pre-ulcerative lesions are identified. No other areas of tenderness bilateral lower extremities. No overlying edema, erythema, increased warmth. No pain with calf compression, swelling, warmth, erythema.  Assessment: Patient presents with symptomatic onychomycosis  Plan: -Treatment options including alternatives, risks, complications were discussed -Nails sharply debrided 10 without complication/bleeding. -Discussed daily foot inspection. If there are any changes, to call the office immediately.  -Follow-up in 3 months or sooner if any problems are to arise. In the meantime, encouraged to call the office with any questions, concerns, changes symptoms.  Ovid CurdMatthew Wagoner, DPM

## 2015-04-04 ENCOUNTER — Ambulatory Visit: Payer: Medicare Other | Admitting: Podiatry

## 2015-10-03 ENCOUNTER — Ambulatory Visit: Payer: Medicare Other | Admitting: Podiatry

## 2015-10-31 ENCOUNTER — Ambulatory Visit (INDEPENDENT_AMBULATORY_CARE_PROVIDER_SITE_OTHER): Payer: Medicare Other | Admitting: Podiatry

## 2015-10-31 ENCOUNTER — Encounter: Payer: Self-pay | Admitting: Podiatry

## 2015-10-31 VITALS — BP 135/68 | HR 58 | Resp 14

## 2015-10-31 DIAGNOSIS — M79676 Pain in unspecified toe(s): Secondary | ICD-10-CM

## 2015-10-31 DIAGNOSIS — B351 Tinea unguium: Secondary | ICD-10-CM

## 2015-10-31 NOTE — Progress Notes (Signed)
Complaint:  Visit Type: Patient returns to my office for  preventative foot care services. Complaint: Patient states" my nails have grown long and thick and become painful to walk and wear shoes"  The patient presents for preventative foot care services. No changes to ROS  Podiatric Exam: Vascular: dorsalis pedis and posterior tibial pulses are palpable bilateral. Capillary return is immediate. Temperature gradient is WNL. Skin turgor WNL  Sensorium: Normal Semmes Weinstein monofilament test. Normal tactile sensation bilaterally. Nail Exam: Pt has thick disfigured discolored nails with subungual debris noted bilateral entire nail hallux through fifth toenails Ulcer Exam: There is no evidence of ulcer or pre-ulcerative changes or infection. Orthopedic Exam: Muscle tone and strength are WNL. No limitations in general ROM. No crepitus or effusions noted. Foot type and digits show no abnormalies.  HAV 1st MPJ with hammer toe second right Skin: No Porokeratosis. No infection or ulcers  Diagnosis:  Onychomycosis, , Pain in right toe, pain in left toes  Treatment & Plan Procedures and Treatment: Consent by patient was obtained for treatment procedures. The patient understood the discussion of treatment and procedures well. All questions were answered thoroughly reviewed. Debridement of mycotic and hypertrophic toenails, 1 through 5 bilateral and clearing of subungual debris. No ulceration, no infection noted.  Return Visit-Office Procedure: Patient instructed to return to the office for a follow up visit 3 months for continued evaluation and treatment.    Helane GuntherGregory Irlene Crudup DPM

## 2016-01-30 ENCOUNTER — Ambulatory Visit: Payer: Medicare Other | Admitting: Podiatry

## 2016-02-20 ENCOUNTER — Ambulatory Visit (INDEPENDENT_AMBULATORY_CARE_PROVIDER_SITE_OTHER): Payer: Medicare Other | Admitting: Podiatry

## 2016-02-20 ENCOUNTER — Encounter: Payer: Self-pay | Admitting: Podiatry

## 2016-02-20 DIAGNOSIS — M79676 Pain in unspecified toe(s): Secondary | ICD-10-CM | POA: Diagnosis not present

## 2016-02-20 DIAGNOSIS — B351 Tinea unguium: Secondary | ICD-10-CM | POA: Diagnosis not present

## 2016-02-20 NOTE — Progress Notes (Signed)
Complaint:  Visit Type: Patient returns to my office for  preventative foot care services. Complaint: Patient states" my nails have grown long and thick and become painful to walk and wear shoes"  The patient presents for preventative foot care services. No changes to ROS  Podiatric Exam: Vascular: dorsalis pedis and posterior tibial pulses are palpable bilateral. Capillary return is immediate. Temperature gradient is WNL. Skin turgor WNL  Sensorium: Normal Semmes Weinstein monofilament test. Normal tactile sensation bilaterally. Nail Exam: Pt has thick disfigured discolored nails with subungual debris noted bilateral entire nail hallux through fifth toenails Ulcer Exam: There is no evidence of ulcer or pre-ulcerative changes or infection. Orthopedic Exam: Muscle tone and strength are WNL. No limitations in general ROM. No crepitus or effusions noted. Foot type and digits show no abnormalies.  HAV 1st MPJ with hammer toe second right Skin: No Porokeratosis. No infection or ulcers  Diagnosis:  Onychomycosis, , Pain in right toe, pain in left toes  Treatment & Plan Procedures and Treatment: Consent by patient was obtained for treatment procedures. The patient understood the discussion of treatment and procedures well. All questions were answered thoroughly reviewed. Debridement of mycotic and hypertrophic toenails, 1 through 5 bilateral and clearing of subungual debris. No ulceration, no infection noted.  Return Visit-Office Procedure: Patient instructed to return to the office for a follow up visit 3 months for continued evaluation and treatment.    Helane GuntherGregory Deadrick Stidd DPM

## 2016-05-21 ENCOUNTER — Ambulatory Visit: Payer: Medicare Other | Admitting: Podiatry

## 2016-06-16 ENCOUNTER — Ambulatory Visit (INDEPENDENT_AMBULATORY_CARE_PROVIDER_SITE_OTHER): Payer: Medicare Other | Admitting: Podiatry

## 2016-06-16 ENCOUNTER — Encounter: Payer: Self-pay | Admitting: Podiatry

## 2016-06-16 DIAGNOSIS — M79676 Pain in unspecified toe(s): Secondary | ICD-10-CM

## 2016-06-16 DIAGNOSIS — B351 Tinea unguium: Secondary | ICD-10-CM | POA: Diagnosis not present

## 2016-06-16 NOTE — Progress Notes (Signed)
Complaint:  Visit Type: Patient returns to my office for  preventative foot care services. Complaint: Patient states" my nails have grown long and thick and become painful to walk and wear shoes"  The patient presents for preventative foot care services. No changes to ROS  Podiatric Exam: Vascular: dorsalis pedis and posterior tibial pulses are palpable bilateral. Capillary return is immediate. Temperature gradient is WNL. Skin turgor WNL  Sensorium: Normal Semmes Weinstein monofilament test. Normal tactile sensation bilaterally. Nail Exam: Pt has thick disfigured discolored nails with subungual debris noted bilateral entire nail hallux through fifth toenails Ulcer Exam: There is no evidence of ulcer or pre-ulcerative changes or infection. Orthopedic Exam: Muscle tone and strength are WNL. No limitations in general ROM. No crepitus or effusions noted. Foot type and digits show no abnormalies.  HAV 1st MPJ with hammer toe second right Skin: No Porokeratosis. No infection or ulcers  Diagnosis:  Onychomycosis, , Pain in right toe, pain in left toes  Treatment & Plan Procedures and Treatment: Consent by patient was obtained for treatment procedures. The patient understood the discussion of treatment and procedures well. All questions were answered thoroughly reviewed. Debridement of mycotic and hypertrophic toenails, 1 through 5 bilateral and clearing of subungual debris. No ulceration, no infection noted.  Return Visit-Office Procedure: Patient instructed to return to the office for a follow up visit 3 months for continued evaluation and treatment.    Helane GuntherGregory Laiken Sandy DPM

## 2016-09-15 ENCOUNTER — Encounter: Payer: Self-pay | Admitting: Podiatry

## 2016-09-15 ENCOUNTER — Ambulatory Visit (INDEPENDENT_AMBULATORY_CARE_PROVIDER_SITE_OTHER): Payer: Medicare Other | Admitting: Podiatry

## 2016-09-15 DIAGNOSIS — B351 Tinea unguium: Secondary | ICD-10-CM | POA: Diagnosis not present

## 2016-09-15 DIAGNOSIS — M79676 Pain in unspecified toe(s): Secondary | ICD-10-CM

## 2016-09-15 NOTE — Progress Notes (Signed)
Complaint:  Visit Type: Patient returns to my office for  preventative foot care services. Complaint: Patient states" my nails have grown long and thick and become painful to walk and wear shoes"  The patient presents for preventative foot care services. No changes to ROS  Podiatric Exam: Vascular: dorsalis pedis are palpable bilateral.  PT pulses are absent due to swelling. Capillary return is immediate. Temperature gradient is WNL. Skin turgor WNL  Sensorium: Normal Semmes Weinstein monofilament test. Normal tactile sensation bilaterally. Nail Exam: Pt has thick disfigured discolored nails with subungual debris noted bilateral entire nail hallux through fifth toenails Ulcer Exam: There is no evidence of ulcer or pre-ulcerative changes or infection. Orthopedic Exam: Muscle tone and strength are WNL. No limitations in general ROM. No crepitus or effusions noted. Foot type and digits show no abnormalies.  HAV 1st MPJ with hammer toe second right Skin: No Porokeratosis. No infection or ulcers  Diagnosis:  Onychomycosis, , Pain in right toe, pain in left toes  Treatment & Plan Procedures and Treatment: Consent by patient was obtained for treatment procedures. The patient understood the discussion of treatment and procedures well. All questions were answered thoroughly reviewed. Debridement of mycotic and hypertrophic toenails, 1 through 5 bilateral and clearing of subungual debris. No ulceration, no infection noted.  Return Visit-Office Procedure: Patient instructed to return to the office for a follow up visit 3 months for continued evaluation and treatment.    Helane Gunther DPM

## 2016-12-21 ENCOUNTER — Ambulatory Visit: Payer: Medicare Other | Admitting: Podiatry

## 2017-08-12 ENCOUNTER — Ambulatory Visit: Payer: Medicare Other | Admitting: Podiatry

## 2017-09-02 ENCOUNTER — Ambulatory Visit (INDEPENDENT_AMBULATORY_CARE_PROVIDER_SITE_OTHER): Payer: Medicare Other | Admitting: Podiatry

## 2017-09-02 ENCOUNTER — Encounter: Payer: Self-pay | Admitting: Podiatry

## 2017-09-02 ENCOUNTER — Encounter

## 2017-09-02 DIAGNOSIS — B351 Tinea unguium: Secondary | ICD-10-CM | POA: Diagnosis not present

## 2017-09-02 DIAGNOSIS — M79674 Pain in right toe(s): Secondary | ICD-10-CM

## 2017-09-02 DIAGNOSIS — M79675 Pain in left toe(s): Secondary | ICD-10-CM

## 2017-09-06 NOTE — Progress Notes (Signed)
Subjective: Sheila Price presents today with  cc of painful, discolored, thick toenails which interfere with daily activities and routine tasks.  Pain is aggravated when wearing enclosed shoe gear. Pain is getting progressively worse and relieved with periodic professional debridement.  Objective: Vascular Examination: Capillary refill time <3 seconds x 10 digits Dorsalis pedis present b/l Posterior tibial pulses absent b/l No digital hair x 10 digits Skin temperature warm to warm b/l  Dermatological Examination: Skin thin and atrophic b/l Toenails 1-5 b/l discolored, thick, dystrophic with subungual debris and pain with palpation to nailbeds due to thickness of nails.  Musculoskeletal: Muscle strength 5/5 to all LE muscle groups HAV b/l Hammertoe 2nd digit right foot  Neurological: Sensation intact with 10 gram monofilament. Vibratory sensation intact.  Assessment: Painful onychomycosis toenails 1-5 b/l   Plan: 1. Toenails 1-5 b/l were debrided in length and girth without iatrogenic bleeding. 2. Patient to continue soft, supportive shoe gear 3. Patient to report any pedal injuries to medical professional immediately. 4. Follow up 3 months.  5. Ms. Sheila Price to call should there be a concern in the interim.

## 2018-01-05 ENCOUNTER — Ambulatory Visit: Payer: Medicare Other | Admitting: Podiatry

## 2018-01-23 ENCOUNTER — Ambulatory Visit (INDEPENDENT_AMBULATORY_CARE_PROVIDER_SITE_OTHER): Payer: Medicare Other | Admitting: Podiatry

## 2018-01-23 ENCOUNTER — Encounter: Payer: Self-pay | Admitting: Podiatry

## 2018-01-23 DIAGNOSIS — M79674 Pain in right toe(s): Secondary | ICD-10-CM

## 2018-01-23 DIAGNOSIS — B351 Tinea unguium: Secondary | ICD-10-CM

## 2018-01-23 DIAGNOSIS — M79675 Pain in left toe(s): Secondary | ICD-10-CM

## 2018-02-18 ENCOUNTER — Encounter: Payer: Self-pay | Admitting: Podiatry

## 2018-02-18 NOTE — Progress Notes (Signed)
Subjective: Sheila Price presents today with painful, thick toenails 1-5 b/l that she cannot cut and which interfere with daily activities.  Pain is aggravated when wearing enclosed shoe gear.  Sheila Cloud, MD is her PCP.   Current Outpatient Medications:  .  amLODipine (NORVASC) 5 MG tablet, , Disp: , Rfl:  .  aspirin 81 MG tablet, Take 81 mg by mouth daily., Disp: , Rfl:  .  atorvastatin (LIPITOR) 20 MG tablet, , Disp: , Rfl:  .  carvedilol (COREG) 12.5 MG tablet, , Disp: , Rfl:  .  cloNIDine (CATAPRES) 0.2 MG tablet, , Disp: , Rfl:  .  dexamethasone (DECADRON) 4 MG tablet, , Disp: , Rfl:  .  HYDROcodone-acetaminophen (NORCO) 10-325 MG per tablet, , Disp: , Rfl:  .  losartan (COZAAR) 100 MG tablet, , Disp: , Rfl:  .  meloxicam (MOBIC) 15 MG tablet, , Disp: , Rfl:  .  multivitamin-iron-minerals-folic acid (CENTRUM) chewable tablet, Chew 1 tablet by mouth daily., Disp: , Rfl:  .  traMADol (ULTRAM) 50 MG tablet, , Disp: , Rfl:   No Known Allergies  Objective:  Vascular Examination: Capillary refill time immediate x 10 digits  Dorsalis pedis pulses present bilaterally  Posterior tibial pulses absent bilaterally  Digital hair present x 10 digits  Skin temperature gradient WNL b/l  Dermatological Examination: Skin thin and atrophic bilaterally  Toenails 1-5 b/l discolored, thick, dystrophic with subungual debris and pain with palpation to nailbeds due to thickness of nails.  Musculoskeletal: Muscle strength 5/5 to all LE muscle groups  Hallux abductovalgus bilaterally  Hammertoe second digit right foot  No pain, crepitus or joint limitation noted with ROM.   Neurological: Sensation intact with 10 gram monofilament. Vibratory sensation intact.  Assessment: Painful onychomycosis toenails 1-5 b/l   Plan: 1. Toenails 1-5 b/l were debrided in length and girth without iatrogenic bleeding. 2. Patient to continue soft, supportive shoe gear 3. Patient to report any  pedal injuries to medical professional immediately. 4. Follow up 3 months. Patient/POA to call should there be a concern in the interim.

## 2018-04-25 ENCOUNTER — Ambulatory Visit: Payer: Medicare Other | Admitting: Podiatry

## 2019-03-13 ENCOUNTER — Other Ambulatory Visit: Payer: Self-pay

## 2019-03-13 ENCOUNTER — Encounter: Payer: Self-pay | Admitting: Podiatry

## 2019-03-13 ENCOUNTER — Ambulatory Visit (INDEPENDENT_AMBULATORY_CARE_PROVIDER_SITE_OTHER): Payer: Medicare Other | Admitting: Podiatry

## 2019-03-13 DIAGNOSIS — I739 Peripheral vascular disease, unspecified: Secondary | ICD-10-CM

## 2019-03-13 DIAGNOSIS — B351 Tinea unguium: Secondary | ICD-10-CM | POA: Diagnosis not present

## 2019-03-13 DIAGNOSIS — M79674 Pain in right toe(s): Secondary | ICD-10-CM

## 2019-03-13 DIAGNOSIS — M79675 Pain in left toe(s): Secondary | ICD-10-CM

## 2019-03-13 DIAGNOSIS — L84 Corns and callosities: Secondary | ICD-10-CM | POA: Diagnosis not present

## 2019-03-13 NOTE — Patient Instructions (Signed)

## 2019-03-15 NOTE — Progress Notes (Signed)
Subjective: Sheila Price presents today for follow up of painful mycotic nails b/l that are difficult to trim. Pain interferes with ambulation. Aggravating factors include wearing enclosed shoe gear. Pain is relieved with periodic professional debridement.   No Known Allergies   Objective: There were no vitals filed for this visit.  Vascular Examination:  Capillary refill time to digits immediate b/l, palpable DP pulses b/l, non-palpable PT pulses b/l, pedal hair sparse b/l and skin temperature gradient within normal limits b/l  Dermatological Examination: Pedal skin is thin shiny, atrophic bilaterally, no open wounds bilaterally, no interdigital macerations bilaterally, toenails 1-5 b/l elongated, dystrophic, thickened, crumbly with subungual debris and hyperkeratotic lesion(s) submet head 5 b/l and distal tip right 3rd/4th digits.  No erythema, no edema, no drainage, no flocculence  Musculoskeletal: Normal muscle strength 5/5 to all lower extremity muscle groups bilaterally, no pain crepitus or joint limitation noted with ROM b/l, bunion deformity noted b/l and hammertoes noted to the  R 2nd toe  Neurological: Protective sensation intact 5/5 intact bilaterally with 10g monofilament b/l and vibratory sensation intact b/l  Assessment: 1. Pain due to onychomycosis of toenails of both feet   2. Corns and callosities   3. PAD (peripheral artery disease) (HCC)    Plan: -Toenails 1-5 b/l were debrided in length and girth without iatrogenic bleeding. -Corns and calluses were debrided without complication or incident. Total number debrided =4, submet head 5 b/l and distal tips right 3rd/4th digits -Patient to continue soft, supportive shoe gear daily. -Patient to report any pedal injuries to medical professional immediately. -Patient/POA to call should there be question/concern in the interim.  Return in about 3 months (around 06/10/2019) for nail trim.

## 2019-06-11 ENCOUNTER — Other Ambulatory Visit: Payer: Self-pay

## 2019-06-11 ENCOUNTER — Ambulatory Visit (INDEPENDENT_AMBULATORY_CARE_PROVIDER_SITE_OTHER): Payer: Medicare Other | Admitting: Podiatry

## 2019-06-11 DIAGNOSIS — M79674 Pain in right toe(s): Secondary | ICD-10-CM

## 2019-06-11 DIAGNOSIS — B351 Tinea unguium: Secondary | ICD-10-CM

## 2019-06-11 DIAGNOSIS — L84 Corns and callosities: Secondary | ICD-10-CM | POA: Diagnosis not present

## 2019-06-11 DIAGNOSIS — I739 Peripheral vascular disease, unspecified: Secondary | ICD-10-CM | POA: Diagnosis not present

## 2019-06-11 DIAGNOSIS — L600 Ingrowing nail: Secondary | ICD-10-CM

## 2019-06-11 DIAGNOSIS — M79675 Pain in left toe(s): Secondary | ICD-10-CM

## 2019-06-11 NOTE — Patient Instructions (Signed)

## 2019-06-17 ENCOUNTER — Encounter: Payer: Self-pay | Admitting: Podiatry

## 2019-06-17 NOTE — Progress Notes (Signed)
Subjective: [name] is a 86 y.o. female patient seen today painful mycotic nails b/l that are difficult to trim. Pain interferes with ambulation. Aggravating factors include wearing enclosed shoe gear. Pain is relieved with periodic professional debridement  Current Outpatient Medications on File Prior to Visit  Medication Sig Dispense Refill  . amLODipine (NORVASC) 10 MG tablet Take 10 mg by mouth daily.    Sheila Price Kitchen amLODipine (NORVASC) 5 MG tablet     . aspirin 81 MG tablet Take 81 mg by mouth daily.    Sheila Price Kitchen atorvastatin (LIPITOR) 20 MG tablet     . carvedilol (COREG) 12.5 MG tablet     . cloNIDine (CATAPRES) 0.2 MG tablet     . dexamethasone (DECADRON) 4 MG tablet     . fluorometholone (FML) 0.1 % ophthalmic suspension 1 drop 3 (three) times daily.    . hydrochlorothiazide (HYDRODIURIL) 25 MG tablet Take 25 mg by mouth daily.    Sheila Price Kitchen HYDROcodone-acetaminophen (NORCO) 10-325 MG per tablet     . losartan (COZAAR) 100 MG tablet     . LOTEMAX SM 0.38 % GEL Apply 1 drop to eye 3 (three) times daily.    . meloxicam (MOBIC) 15 MG tablet     . multivitamin-iron-minerals-folic acid (CENTRUM) chewable tablet Chew 1 tablet by mouth daily.    Sheila Price Kitchen omeprazole (PRILOSEC) 20 MG capsule Take 20 mg by mouth daily.    . RESTASIS 0.05 % ophthalmic emulsion 1 drop 2 (two) times daily.    Sheila Price Kitchen spironolactone (ALDACTONE) 25 MG tablet Take 25 mg by mouth daily.    . traMADol (ULTRAM) 50 MG tablet      No current facility-administered medications on file prior to visit.    No Known Allergies  Objective: Physical Exam  General: Sheila Price is a pleasant 86 y.o.  African American female, in NAD. AAO x 3.   Vascular:  Neurovascular status unchanged b/l. Capillary refill time to digits immediate b/l. Palpable DP pulses b/l. Nonpalpable PT pulses b/l. Pedal hair sparse b/l. Skin temperature gradient within normal limits b/l.  Dermatological:  Pedal skin with normal turgor, texture and tone bilaterally. Pedal skin  is thin shiny, atrophic bilaterally. No open wounds bilaterally. No interdigital macerations bilaterally. Toenails 1-5 b/l elongated, dystrophic, thickened, crumbly with subungual debris and tenderness to dorsal palpation. Hyperkeratotic lesion(s) submet head 3 right foot, submet head 4 right foot, submet head 5 left foot and submet head 5 right foot.  No erythema, no edema, no drainage, no flocculence.   Incurvated nailplate right 3rd digit medial border(s) with tenderness to palpation. No erythema, no edema, no drainage noted.  Musculoskeletal:  Normal muscle strength 5/5 to all lower extremity muscle groups bilaterally. No pain crepitus or joint limitation noted with ROM b/l. Hallux valgus with bunion deformity noted b/l. Hammertoes noted to the R 2nd toe.   She is wearing anklet socks which are too tight around her ankles.   Neurological:  Protective sensation intact 5/5 intact bilaterally with 10g monofilament b/l. Vibratory sensation intact b/l.  Assessment and Plan:  1. Pain due to onychomycosis of toenails of both feet   2. Ingrown toenail without infection   3. Corns and callosities   4. PAD (peripheral artery disease) (HCC)    -Examined patient. -Toenails 1-5 b/l were debrided in length and girth with sterile nail nippers and dremel without iatrogenic bleeding.  -She has compression hose at home. Advised daughter to apply every morning and remove every evening. If she won't  wear the compression hose, they are to purchase diabetic socks. Both related understanding. -Patient to continue soft, supportive shoe gear daily. -Patient to report any pedal injuries to medical professional immediately. -Offending nail border debrided and curretaged R 3rd toe utilizing sterile nail nipper and currette. Border(s) cleansed with alcohol and triple antibiotic ointment applied. Patient instructed to apply triple antibiotic ointment to R 3rd toe once daily for 7 days. Call if she has any  problems. -Patient/POA to call should there be question/concern in the interim.  Return in about 3 months (around 09/11/2019) for nail and callus trim.  Sheila Price, DPM

## 2019-09-10 ENCOUNTER — Ambulatory Visit: Payer: Medicare Other | Admitting: Podiatry

## 2019-09-17 ENCOUNTER — Ambulatory Visit: Payer: Medicare Other | Admitting: Podiatry

## 2019-12-18 ENCOUNTER — Ambulatory Visit: Payer: Medicare Other | Admitting: Podiatry

## 2020-01-07 ENCOUNTER — Ambulatory Visit (INDEPENDENT_AMBULATORY_CARE_PROVIDER_SITE_OTHER): Payer: Medicare Other | Admitting: Podiatry

## 2020-01-07 ENCOUNTER — Encounter: Payer: Self-pay | Admitting: Podiatry

## 2020-01-07 ENCOUNTER — Other Ambulatory Visit: Payer: Self-pay

## 2020-01-07 DIAGNOSIS — M79675 Pain in left toe(s): Secondary | ICD-10-CM

## 2020-01-07 DIAGNOSIS — L600 Ingrowing nail: Secondary | ICD-10-CM

## 2020-01-07 DIAGNOSIS — M79674 Pain in right toe(s): Secondary | ICD-10-CM | POA: Diagnosis not present

## 2020-01-07 DIAGNOSIS — B351 Tinea unguium: Secondary | ICD-10-CM | POA: Diagnosis not present

## 2020-01-07 DIAGNOSIS — I739 Peripheral vascular disease, unspecified: Secondary | ICD-10-CM

## 2020-01-12 NOTE — Progress Notes (Signed)
Subjective: Sheila Price is a 86 y.o. female patient seen today for at risk foot care. Patient has h/o PAD and painful thick toenails that are difficult to trim. Pain interferes with ambulation. Aggravating factors include wearing enclosed shoe gear. Pain is relieved with periodic professional debridement.   Her daughter is present during today's visit. They voice no new pedal concerns on today's visit.   Current Outpatient Medications on File Prior to Visit  Medication Sig Dispense Refill  . amLODipine (NORVASC) 10 MG tablet Take 10 mg by mouth daily.    Marland Kitchen amLODipine (NORVASC) 5 MG tablet     . aspirin 81 MG tablet Take 81 mg by mouth daily.    Marland Kitchen atorvastatin (LIPITOR) 20 MG tablet     . carvedilol (COREG) 12.5 MG tablet     . cloNIDine (CATAPRES) 0.2 MG tablet     . dexamethasone (DECADRON) 4 MG tablet     . fluorometholone (FML) 0.1 % ophthalmic suspension 1 drop 3 (three) times daily.    . hydrochlorothiazide (HYDRODIURIL) 25 MG tablet Take 25 mg by mouth daily.    Marland Kitchen HYDROcodone-acetaminophen (NORCO) 10-325 MG per tablet     . losartan (COZAAR) 100 MG tablet     . LOTEMAX SM 0.38 % GEL Apply 1 drop to eye 3 (three) times daily.    . meloxicam (MOBIC) 15 MG tablet     . multivitamin-iron-minerals-folic acid (CENTRUM) chewable tablet Chew 1 tablet by mouth daily.    Marland Kitchen omeprazole (PRILOSEC) 20 MG capsule Take 20 mg by mouth daily.    . RESTASIS 0.05 % ophthalmic emulsion 1 drop 2 (two) times daily.    Marland Kitchen spironolactone (ALDACTONE) 25 MG tablet Take 25 mg by mouth daily.    . traMADol (ULTRAM) 50 MG tablet      No current facility-administered medications on file prior to visit.    No Known Allergies  Objective: Physical Exam  General: Sheila Price is a pleasant 86 y.o.  African American female, in NAD. AAO x 3.   Vascular:  Neurovascular status unchanged b/l lower extremities. Capillary refill time to digits immediate b/l. Palpable pedal pulses b/l LE. Nonpalpable PT pulse(s)  b/l lower extremities. Pedal hair sparse. Lower extremity skin temperature gradient within normal limits. Trace edema noted b/l lower extremities.  Dermatological:  Pedal skin with normal turgor, texture and tone bilaterally. Pedal skin is thin shiny, atrophic b/l lower extremities. No open wounds bilaterally. No interdigital macerations bilaterally. Toenails 1-5 b/l elongated, discolored, dystrophic, thickened, crumbly with subungual debris and tenderness to dorsal palpation.   Incurvated nailplate right 3rd digit medial border(s) with tenderness to palpation. No erythema, no edema, no drainage noted.  Musculoskeletal:  Normal muscle strength 5/5 to all lower extremity muscle groups bilaterally. No pain crepitus or joint limitation noted with ROM b/l. Hallux valgus with bunion deformity noted b/l. Hammertoes noted to the R 2nd toe.   Neurological:  Protective sensation intact 5/5 intact bilaterally with 10g monofilament b/l. Vibratory sensation intact b/l.  Assessment and Plan:  1. Pain due to onychomycosis of toenails of both feet   2. Ingrown toenail without infection   3. PAD (peripheral artery disease) (HCC)    -Examined patient. -Toenails 1-5 b/l were debrided in length and girth with sterile nail nippers and dremel without iatrogenic bleeding.  -Patient to continue soft, supportive shoe gear daily. -Patient to report any pedal injuries to medical professional immediately. -Offending nail border debrided and curretaged R 3rd toe utilizing sterile  nail nipper and currette. Border(s) cleansed with alcohol and triple antibiotic ointment applied. Patient instructed to apply triple antibiotic ointment to R 3rd toe once daily for 7 days. Call if she has any problems. -Patient/POA to call should there be question/concern in the interim.  Return in about 3 months (around 04/06/2020) for painful mycotic toenails.  [name], DPM

## 2020-04-14 ENCOUNTER — Other Ambulatory Visit: Payer: Self-pay

## 2020-04-14 ENCOUNTER — Ambulatory Visit (INDEPENDENT_AMBULATORY_CARE_PROVIDER_SITE_OTHER): Payer: Medicare Other | Admitting: Podiatry

## 2020-04-14 DIAGNOSIS — M79675 Pain in left toe(s): Secondary | ICD-10-CM

## 2020-04-14 DIAGNOSIS — M79674 Pain in right toe(s): Secondary | ICD-10-CM

## 2020-04-14 DIAGNOSIS — B351 Tinea unguium: Secondary | ICD-10-CM

## 2020-04-19 ENCOUNTER — Encounter: Payer: Self-pay | Admitting: Podiatry

## 2020-04-19 NOTE — Progress Notes (Signed)
Subjective: Sheila Price is a 87 y.o. female patient seen today for at risk foot care. Patient has h/o PAD and painful thick toenails that are difficult to trim. Pain interferes with ambulation. Aggravating factors include wearing enclosed shoe gear. Pain is relieved with periodic professional debridement.   Her daughter is present during today's visit. They voice no new pedal concerns on today's visit.  PCP is Dr. [name] , and last visit was January of 2022.  No Known Allergies  Objective: Physical Exam  General: Sheila Price is a pleasant 87 y.o.  African American female, in NAD. AAO x 3.   Vascular:  Capillary refill time to digits immediate b/l. Palpable DP pulse(s) b/l lower extremities Nonpalpable PT pulse(s) b/l lower extremities. Pedal hair sparse. Lower extremity skin temperature gradient within normal limits. Trace edema noted b/l lower extremities.  Dermatological:  Pedal skin with normal turgor, texture and tone bilaterally. Pedal skin is thin shiny, atrophic b/l lower extremities. No open wounds bilaterally. No interdigital macerations bilaterally. Toenails 1-5 b/l elongated, discolored, dystrophic, thickened, crumbly with subungual debris and tenderness to dorsal palpation.   Incurvated nailplate right 3rd digit medial border(s) with tenderness to palpation. No erythema, no edema, no drainage noted.  Musculoskeletal:  Normal muscle strength 5/5 to all lower extremity muscle groups bilaterally. No pain crepitus or joint limitation noted with ROM b/l. Hallux valgus with bunion deformity noted b/l. Hammertoes noted to the R 2nd toe.   Neurological:  Protective sensation intact 5/5 intact bilaterally with 10g monofilament b/l. Vibratory sensation intact b/l.  Assessment and Plan:  1. Pain due to onychomycosis of toenails of both feet    -Examined patient. -Toenails 1-5 b/l were debrided in length and girth with sterile nail nippers and dremel without iatrogenic  bleeding.Offending nail borders debrided and curretaged R 3rd toe. Borders cleansed with alcohol. Antibiotic ointment applied. No further treatment required by patient.  -Patient to continue soft, supportive shoe gear daily. -Patient to report any pedal injuries to medical professional immediately. -Patient/POA to call should there be question/concern in the interim.  Return in about 3 months (around 07/15/2020).  [name], DPM

## 2020-07-21 ENCOUNTER — Other Ambulatory Visit: Payer: Self-pay

## 2020-07-21 ENCOUNTER — Encounter: Payer: Self-pay | Admitting: Podiatry

## 2020-07-21 ENCOUNTER — Ambulatory Visit (INDEPENDENT_AMBULATORY_CARE_PROVIDER_SITE_OTHER): Payer: Medicare Other | Admitting: Podiatry

## 2020-07-21 DIAGNOSIS — B351 Tinea unguium: Secondary | ICD-10-CM | POA: Diagnosis not present

## 2020-07-21 DIAGNOSIS — M79674 Pain in right toe(s): Secondary | ICD-10-CM | POA: Diagnosis not present

## 2020-07-21 DIAGNOSIS — M79675 Pain in left toe(s): Secondary | ICD-10-CM | POA: Diagnosis not present

## 2020-07-21 DIAGNOSIS — I739 Peripheral vascular disease, unspecified: Secondary | ICD-10-CM | POA: Diagnosis not present

## 2020-07-24 NOTE — Progress Notes (Signed)
Subjective: Sheila Price is a pleasant 87 y.o. female patient seen today painful thick toenails that are difficult to trim. Pain interferes with ambulation. Aggravating factors include wearing enclosed shoe gear. Pain is relieved with periodic professional debridement.  She notes no new pedal problems on today's visit. Her daughter accompanies her on today's visit.  PCP is [name], MD. Last visit was: two months ago per patient recall.  She voices no new pedal problems on today's visit. States she has been dealing with hearing issues.  No Known Allergies  Objective: Physical Exam  General: Sheila Price is a pleasant 87 y.o. African American female, in NAD. AAO x 3.   Vascular:  Capillary refill time to digits immediate b/l. Palpable DP pulse(s) b/l lower extremities Nonpalpable PT pulse(s) b/l lower extremities. Pedal hair sparse. Lower extremity skin temperature gradient within normal limits. Trace edema noted b/l lower extremities.  Dermatological:  Pedal skin is thin shiny, atrophic b/l lower extremities. No open wounds b/l lower extremities No interdigital macerations b/l lower extremities Toenails 1-5 b/l elongated, discolored, dystrophic, thickened, crumbly with subungual debris and tenderness to dorsal palpation.  Musculoskeletal:  Normal muscle strength 5/5 to all lower extremity muscle groups bilaterally. No pain crepitus or joint limitation noted with ROM b/l. Hallux valgus with bunion deformity noted b/l lower extremities. Hammertoe(s) noted to the L 2nd toe.  Neurological:  Protective sensation intact 5/5 intact bilaterally with 10g monofilament b/l. Vibratory sensation intact b/l.  Assessment and Plan:  1. Pain due to onychomycosis of toenails of both feet   2. PAD (peripheral artery disease) (HCC)     -Examined patient. -No new findings. No new orders. -Patient to continue soft, supportive shoe gear daily. -Toenails 1-5 b/l were debrided in length and girth  with sterile nail nippers and dremel without iatrogenic bleeding.  -Patient to report any pedal injuries to medical professional immediately. -Patient/POA to call should there be question/concern in the interim.  Return in about 3 months (around 10/21/2020).  [name], DPM

## 2020-10-20 ENCOUNTER — Other Ambulatory Visit: Payer: Self-pay

## 2020-10-20 ENCOUNTER — Ambulatory Visit (INDEPENDENT_AMBULATORY_CARE_PROVIDER_SITE_OTHER): Payer: Medicare Other | Admitting: Podiatry

## 2020-10-20 DIAGNOSIS — M79675 Pain in left toe(s): Secondary | ICD-10-CM | POA: Diagnosis not present

## 2020-10-20 DIAGNOSIS — B351 Tinea unguium: Secondary | ICD-10-CM

## 2020-10-20 DIAGNOSIS — M79674 Pain in right toe(s): Secondary | ICD-10-CM

## 2020-10-20 DIAGNOSIS — I739 Peripheral vascular disease, unspecified: Secondary | ICD-10-CM | POA: Diagnosis not present

## 2020-10-24 ENCOUNTER — Encounter: Payer: Self-pay | Admitting: Podiatry

## 2020-10-24 NOTE — Progress Notes (Signed)
  Subjective:  Patient ID: Sheila Price, female    DOB: February 15, 1929,  MRN: [id number]  Sheila Price presents to clinic today for at risk foot care. Pt has h/o NIDDM with PAD and thick, elongated toenails b/l feet which are tender when wearing enclosed shoe gear.  Her daughter is present during today's visit.  PCP is [name], MD , and last visit was 08/20/2020.  No Known Allergies  Review of Systems: Negative except as noted in the HPI. Objective:   Constitutional Sheila Price is a pleasant 87 y.o. African American female, WD, WN in NAD. AAO x 3.   Vascular Capillary fill time to digits <3 seconds b/l lower extremities. Palpable DP pulse(s) b/l lower extremities Nonpalpable PT pulse(s) b/l lower extremities. Pedal hair absent. Lower extremity skin temperature gradient within normal limits. No pain with calf compression b/l. Trace edema noted b/l lower extremities. No cyanosis or clubbing noted.  Neurologic Normal speech. Oriented to person, place, and time. Protective sensation intact 5/5 intact bilaterally with 10g monofilament b/l.  Dermatologic Skin warm and supple b/l lower extremities. No open wounds b/l lower extremities. No interdigital macerations b/l lower extremities. Toenails 1-5 b/l elongated, discolored, dystrophic, thickened, crumbly with subungual debris and tenderness to dorsal palpation.  Orthopedic: Normal muscle strength 5/5 to all lower extremity muscle groups bilaterally. Hallux valgus with bunion deformity noted b/l lower extremities. Hammertoe(s) noted to the L 2nd toe.   Radiographs: None Assessment:   1. Pain due to onychomycosis of toenails of both feet   2. PAD (peripheral artery disease) (HCC)    Plan:  Patient was evaluated and treated and all questions answered. Consent given for treatment as described below: -No new findings. No new orders. -Patient/POA educated on dangers of using sharp instrumentation on toes/feet. Recommended continued  professional foot care in presence of PAD. Patient/POA relates understanding. -Patient to continue soft, supportive shoe gear daily. -Toenails 1-5 b/l were debrided in length and girth with sterile nail nippers and dremel without iatrogenic bleeding.  -Patient to report any pedal injuries to medical professional immediately. -Patient/POA to call should there be question/concern in the interim.  Return in about 3 months (around 01/19/2021).  Sheila Price, DPM

## 2021-01-28 ENCOUNTER — Ambulatory Visit (INDEPENDENT_AMBULATORY_CARE_PROVIDER_SITE_OTHER): Payer: Medicare Other | Admitting: Podiatry

## 2021-01-28 ENCOUNTER — Other Ambulatory Visit: Payer: Self-pay

## 2021-01-28 DIAGNOSIS — M79675 Pain in left toe(s): Secondary | ICD-10-CM | POA: Diagnosis not present

## 2021-01-28 DIAGNOSIS — B351 Tinea unguium: Secondary | ICD-10-CM

## 2021-01-28 DIAGNOSIS — M79674 Pain in right toe(s): Secondary | ICD-10-CM | POA: Diagnosis not present

## 2021-01-28 DIAGNOSIS — I739 Peripheral vascular disease, unspecified: Secondary | ICD-10-CM | POA: Diagnosis not present

## 2021-02-01 ENCOUNTER — Encounter: Payer: Self-pay | Admitting: Podiatry

## 2021-02-01 NOTE — Progress Notes (Signed)
Subjective: Sheila Price is a pleasant 88 y.o. female patient seen today for at risk foot care with h/o PAD. She is seen for painful elongated mycotic toenails 1-5 bilaterally which are tender when wearing enclosed shoe gear. Pain is relieved with periodic professional debridement.   Patient notes no new pedal problems on today's visit.  PCP is [name], MD. Last visit was: 08/20/2020.  No Known Allergies  Objective: Physical Exam  General: Sheila Price is a pleasant 88 y.o. African American female, WD, WN in NAD. AAO x 3.   Vascular:  CFT <3 seconds b/l LE. Faintly palpable DP pulses b/l LE. Nonpalpable PT pulse(s) b/l LE. No pain with calf compression b/l. Trace edema noted BLE.   Dermatological:  Pedal integument with normal turgor, texture and tone b/l LE. No open wounds b/l. No interdigital macerations b/l. Toenails 1-5 b/l elongated, thickened, discolored with subungual debris. +Tenderness with dorsal palpation of nailplates. No hyperkeratotic or porokeratotic lesions present.   Musculoskeletal:  Muscle strength 5/5 to all lower extremity muscle groups bilaterally. HAV with bunion deformity noted b/l LE. Hammertoe(s) noted to the L 2nd toe.   Neurological:  Protective sensation intact 5/5 intact bilaterally with 10g monofilament b/l.   Assessment and Plan:  1. Pain due to onychomycosis of toenails of both feet   2. PAD (peripheral artery disease) (HCC)      Patient was evaluated and treated and all questions answered. Consent given for treatment as described below: -Examined patient. -Mycotic toenails 1-5 bilaterally were debrided in length and girth with sterile nail nippers and dremel without incident. -Patient/POA to call should there be question/concern in the interim.  Return in about 3 months (around 04/28/2021).  [name], DPM

## 2021-04-20 ENCOUNTER — Ambulatory Visit: Payer: Medicare Other | Admitting: Podiatry

## 2021-04-28 ENCOUNTER — Ambulatory Visit (INDEPENDENT_AMBULATORY_CARE_PROVIDER_SITE_OTHER): Payer: Medicare Other | Admitting: Podiatry

## 2021-04-28 ENCOUNTER — Encounter: Payer: Self-pay | Admitting: Podiatry

## 2021-04-28 DIAGNOSIS — I739 Peripheral vascular disease, unspecified: Secondary | ICD-10-CM | POA: Diagnosis not present

## 2021-04-28 DIAGNOSIS — B351 Tinea unguium: Secondary | ICD-10-CM

## 2021-04-28 DIAGNOSIS — M79674 Pain in right toe(s): Secondary | ICD-10-CM | POA: Diagnosis not present

## 2021-04-28 DIAGNOSIS — M79675 Pain in left toe(s): Secondary | ICD-10-CM | POA: Diagnosis not present

## 2021-05-03 NOTE — Progress Notes (Signed)
?  Subjective:  ?Patient ID: GLYNIS HUNSUCKER, female    DOB: 1929/04/23,  MRN: [id number] ? ?Deisha G Blaize presents to clinic today for for at risk foot care. Patient has h/o PAD and painful elongated mycotic toenails 1-5 bilaterally which are tender when wearing enclosed shoe gear. Pain is relieved with periodic professional debridement. ? ?New problem(s): None.  ? ?PCP is [name], MD , and last visit was January, 2023 ? ?Her daughter is present during today's visit.. ? ?No Known Allergies ? ?Review of Systems: Negative except as noted in the HPI. ? ?Objective: No changes noted in today's physical examination. ?General: HELLON VACCARELLA is a pleasant 88 y.o. African American female, WD, WN in NAD. AAO x 3.  ? ?Vascular:  ?CFT <3 seconds b/l LE. Faintly palpable DP pulses b/l LE. Nonpalpable PT pulse(s) b/l LE. No pain with calf compression b/l. Trace edema noted BLE. Digital hair absent b/l LE. ? ?Dermatological:  ?Pedal integument with normal turgor, texture and tone b/l LE. No open wounds b/l. No interdigital macerations b/l. Toenails 1-5 b/l elongated, thickened, discolored with subungual debris. +Tenderness with dorsal palpation of nailplates. No hyperkeratotic or porokeratotic lesions present.  ? ?Musculoskeletal:  ?Muscle strength 5/5 to all lower extremity muscle groups bilaterally. HAV with bunion deformity noted b/l LE. Hammertoe(s) noted to the L 2nd toe.  ? ?Neurological:  ?Protective sensation intact 5/5 intact bilaterally with 10g monofilament b/l.  ? ?Assessment/Plan: ?1. Pain due to onychomycosis of toenails of both feet   ?2. PAD (peripheral artery disease) (HCC)   ?  ?-Patient was evaluated and treated. All patient's and/or POA's questions/concerns answered on today's visit. ?-Patient to continue soft, supportive shoe gear daily. ?-Toenails 1-5 bilaterally were debrided in length and girth with sterile nail nippers and dremel. Pinpoint bleeding of R 4th toe addressed with Lumicain Hemostatic  Solution, cleansed with alcohol. triple antibiotic ointment applied. Patient instructed to apply triple antibiotic ointment once daily for 7 days. ?-Patient/POA to call should there be question/concern in the interim.  ? ?Return in about 3 months (around 07/28/2021). ? ?[name], DPM  ?

## 2021-07-31 ENCOUNTER — Ambulatory Visit: Payer: Medicare Other | Admitting: Podiatry

## 2021-08-04 ENCOUNTER — Ambulatory Visit (INDEPENDENT_AMBULATORY_CARE_PROVIDER_SITE_OTHER): Payer: Medicare Other | Admitting: Podiatry

## 2021-08-04 ENCOUNTER — Encounter: Payer: Self-pay | Admitting: Podiatry

## 2021-08-04 DIAGNOSIS — M79674 Pain in right toe(s): Secondary | ICD-10-CM

## 2021-08-04 DIAGNOSIS — M79675 Pain in left toe(s): Secondary | ICD-10-CM

## 2021-08-04 DIAGNOSIS — I739 Peripheral vascular disease, unspecified: Secondary | ICD-10-CM

## 2021-08-04 DIAGNOSIS — B351 Tinea unguium: Secondary | ICD-10-CM

## 2021-08-09 NOTE — Progress Notes (Signed)
  Subjective:  Patient ID: Sheila Price, female    DOB: 01-Nov-1929,  MRN: [id number]  Sheila Price presents to clinic today for for at risk foot care. Patient has h/o PAD and painful elongated mycotic toenails 1-5 bilaterally which are tender when wearing enclosed shoe gear. Pain is relieved with periodic professional debridement.  Patient is accompanied by her caregiver.  New problem(s): None.   PCP is [name], MD , and last visit was  April, 2023.  No Known Allergies  Review of Systems: Negative except as noted in the HPI.  Objective: No changes noted in today's physical examination. General: Sheila Price is a pleasant 88 y.o. African American female, WD, WN in NAD. AAO x 3.   Vascular:  CFT <3 seconds b/l LE. Faintly palpable DP pulses b/l LE. Nonpalpable PT pulse(s) b/l LE. No pain with calf compression b/l. Trace edema noted BLE. Digital hair absent b/l LE.  Dermatological:  Pedal integument with normal turgor, texture and tone b/l LE. No open wounds b/l. No interdigital macerations b/l. Toenails 1-5 b/l elongated, thickened, discolored with subungual debris. +Tenderness with dorsal palpation of nailplates. No hyperkeratotic or porokeratotic lesions present.   Musculoskeletal:  Muscle strength 5/5 to all lower extremity muscle groups bilaterally. HAV with bunion deformity noted b/l LE. Hammertoe(s) noted to the L 2nd toe.   Neurological:  Protective sensation intact 5/5 intact bilaterally with 10g monofilament b/l.   Assessment/Plan: 1. Pain due to onychomycosis of toenails of both feet   2. PAD (peripheral artery disease) (HCC)     -Examined patient. -Patient to continue soft, supportive shoe gear daily. -Mycotic toenails 1-5 bilaterally were debrided in length and girth with sterile nail nippers and dremel without incident. -Patient/POA to call should there be question/concern in the interim.   Return in about 3 months (around 11/04/2021).  [name], DPM

## 2021-11-10 ENCOUNTER — Encounter: Payer: Self-pay | Admitting: Podiatry

## 2021-11-10 ENCOUNTER — Ambulatory Visit (INDEPENDENT_AMBULATORY_CARE_PROVIDER_SITE_OTHER): Payer: Medicare Other | Admitting: Podiatry

## 2021-11-10 DIAGNOSIS — I739 Peripheral vascular disease, unspecified: Secondary | ICD-10-CM | POA: Diagnosis not present

## 2021-11-10 DIAGNOSIS — M79675 Pain in left toe(s): Secondary | ICD-10-CM

## 2021-11-10 DIAGNOSIS — B351 Tinea unguium: Secondary | ICD-10-CM | POA: Diagnosis not present

## 2021-11-10 DIAGNOSIS — M79674 Pain in right toe(s): Secondary | ICD-10-CM | POA: Diagnosis not present

## 2021-11-10 NOTE — Progress Notes (Signed)
  Subjective:  Patient ID: Sheila Price, female    DOB: 12/22/1929,  MRN: [id number]  Sheila Price presents to clinic today for at risk foot care with h/o PAD:  Chief Complaint  Patient presents with   Nail Problem    Routine foot care PCP-Harwani, Mohan PCP VST-07/2021   New problem(s): None.   She recently celebrated her 89th birthday.  PCP is Charolette Forward, MD.  No Known Allergies  Review of Systems: Negative except as noted in the HPI.  Objective: No changes noted in today's physical examination.  Sheila Price is a pleasant 88 y.o. female in NAD. AAO x 3.  Vascular:  CFT <3 seconds b/l LE. Faintly palpable DP pulses b/l LE. Nonpalpable PT pulse(s) b/l LE. No pain with calf compression b/l. Trace edema noted BLE. Digital hair absent b/l LE.  Dermatological:  Pedal integument with normal turgor, texture and tone b/l LE. No open wounds b/l. No interdigital macerations b/l. Toenails 1-5 b/l elongated, thickened, discolored with subungual debris. +Tenderness with dorsal palpation of nailplates. No hyperkeratotic or porokeratotic lesions present.   Musculoskeletal:  Muscle strength 5/5 to all lower extremity muscle groups bilaterally. HAV with bunion deformity noted b/l LE. Hammertoe(s) noted to the L 2nd toe.   Neurological:  Protective sensation intact 5/5 intact bilaterally with 10g monofilament b/l.   Assessment/Plan: 1. Pain due to onychomycosis of toenails of both feet   2. PAD (peripheral artery disease) ([place])     No orders of the defined types were placed in this encounter.   -Patient's family member present. All questions/concerns addressed on today's visit. -No new findings. No new orders. -Toenails 1-5 b/l were debrided in length and girth with sterile nail nippers and dremel without iatrogenic bleeding.  -Patient/POA to call should there be question/concern in the interim.   Return in about 3 months (around 02/10/2022).  Marzetta Board, DPM

## 2022-02-16 ENCOUNTER — Encounter: Payer: Self-pay | Admitting: Podiatry

## 2022-02-16 ENCOUNTER — Ambulatory Visit (INDEPENDENT_AMBULATORY_CARE_PROVIDER_SITE_OTHER): Payer: Medicare Other | Admitting: Podiatry

## 2022-02-16 VITALS — BP 161/61

## 2022-02-16 DIAGNOSIS — I739 Peripheral vascular disease, unspecified: Secondary | ICD-10-CM

## 2022-02-16 DIAGNOSIS — M79674 Pain in right toe(s): Secondary | ICD-10-CM | POA: Diagnosis not present

## 2022-02-16 DIAGNOSIS — M79675 Pain in left toe(s): Secondary | ICD-10-CM

## 2022-02-16 DIAGNOSIS — B351 Tinea unguium: Secondary | ICD-10-CM

## 2022-02-16 NOTE — Progress Notes (Signed)
  Subjective:  Patient ID: Sheila Price, female    DOB: April 22, 1929,  MRN: [id number]  Sheila Price presents to clinic today for at risk foot care. Patient has h/o PAD and painful thick toenails that are difficult to trim. Pain interferes with ambulation. Aggravating factors include wearing enclosed shoe gear. Pain is relieved with periodic professional debridement.  Chief Complaint  Patient presents with   Nail Problem    RFC PCP-Harwani PCP VST-3 months ago   New problem(s): None.   PCP is Charolette Forward, MD.  No Known Allergies  Review of Systems: Negative except as noted in the HPI.  Objective: No changes noted in today's physical examination. Vitals:   02/16/22 1640  BP: (!) 161/61   Sheila Price is a pleasant 89 y.o. female morbidly obese in NAD. AAO x 3.  Vascular:  CFT <3 seconds b/l LE. Faintly palpable DP pulses b/l LE. Nonpalpable PT pulse(s) b/l LE. No pain with calf compression b/l. Lower extremity temperature gradient warm to warm b/l. Nonpitting edema b/l LE. Digital hair absent b/l LE.  Dermatological:  Pedal integument with normal turgor, texture and tone b/l LE. No open wounds b/l. No interdigital macerations b/l. Toenails 1-5 b/l elongated, thickened, discolored with subungual debris. +Tenderness with dorsal palpation of nailplates. No hyperkeratotic or porokeratotic lesions present.   Dry, flaky skin plaques dorsal aspect left forefoot.   Musculoskeletal:  Muscle strength 5/5 to all lower extremity muscle groups bilaterally. HAV with bunion deformity noted b/l LE. Hammertoe(s) noted to the L 2nd toe.   Neurological:  Protective sensation intact 5/5 intact bilaterally with 10g monofilament b/l.   Assessment/Plan: 1. Pain due to onychomycosis of toenails of both feet   2. PAD (peripheral artery disease) ([place])     -Patient's family member present. All questions/concerns addressed on today's visit. -For dry skin dorsal left foot, recommended weekly foot  soaks with gentle cleansing with baby washcloth. Dry feet well and apply moisturizer daily. Daughter related understanding. -Continue supportive shoe gear daily. -Toenails 1-5 b/l were debrided in length and girth with sterile nail nippers and dremel without iatrogenic bleeding.  -Patient/POA to call should there be question/concern in the interim.   Return in about 3 months (around 05/18/2022).  Marzetta Board, DPM

## 2022-05-19 ENCOUNTER — Ambulatory Visit (INDEPENDENT_AMBULATORY_CARE_PROVIDER_SITE_OTHER): Payer: Medicare Other | Admitting: Podiatry

## 2022-05-19 DIAGNOSIS — M79675 Pain in left toe(s): Secondary | ICD-10-CM | POA: Diagnosis not present

## 2022-05-19 DIAGNOSIS — M79674 Pain in right toe(s): Secondary | ICD-10-CM | POA: Diagnosis not present

## 2022-05-19 DIAGNOSIS — B351 Tinea unguium: Secondary | ICD-10-CM

## 2022-05-19 NOTE — Progress Notes (Signed)
   No chief complaint on file.   SUBJECTIVE Patient presents to office today complaining of elongated, thickened nails that cause pain while ambulating in shoes.  Patient is unable to trim their own nails. Patient is here for further evaluation and treatment.  Past Medical History:  Diagnosis Date   Arthritis    Glaucoma    Hyperlipidemia    Hypertension     No Known Allergies   OBJECTIVE General Patient is awake, alert, and oriented x 3 and in no acute distress. Derm Skin is dry and supple bilateral. Negative open lesions or macerations. Remaining integument unremarkable. Nails are tender, long, thickened and dystrophic with subungual debris, consistent with onychomycosis, 1-5 bilateral. No signs of infection noted. Vasc  DP and PT pedal pulses palpable bilaterally. Temperature gradient within normal limits.  Neuro Epicritic and protective threshold sensation grossly intact bilaterally.  Musculoskeletal Exam No symptomatic pedal deformities noted bilateral. Muscular strength within normal limits.  ASSESSMENT 1.  Pain due to onychomycosis of toenails both  PLAN OF CARE 1. Patient evaluated today.  2. Instructed to maintain good pedal hygiene and foot care.  3. Mechanical debridement of nails 1-5 bilaterally performed using a nail nipper. Filed with dremel without incident.  4. Return to clinic in 3 mos.    Felecia Shelling, DPM [facility]  Dr. Felecia Shelling, DPM    [address] [place], [place] [address]                Office [phone]  Fax [phone]

## 2022-05-25 ENCOUNTER — Ambulatory Visit: Payer: Medicare Other | Admitting: Podiatry

## 2022-08-24 ENCOUNTER — Ambulatory Visit (INDEPENDENT_AMBULATORY_CARE_PROVIDER_SITE_OTHER): Payer: Medicare Other | Admitting: Podiatry

## 2022-08-24 ENCOUNTER — Encounter: Payer: Self-pay | Admitting: Podiatry

## 2022-08-24 DIAGNOSIS — M79674 Pain in right toe(s): Secondary | ICD-10-CM | POA: Diagnosis not present

## 2022-08-24 DIAGNOSIS — I739 Peripheral vascular disease, unspecified: Secondary | ICD-10-CM

## 2022-08-24 DIAGNOSIS — B351 Tinea unguium: Secondary | ICD-10-CM

## 2022-08-24 DIAGNOSIS — M79675 Pain in left toe(s): Secondary | ICD-10-CM

## 2022-08-29 NOTE — Progress Notes (Signed)
  Subjective:  Patient ID: Sheila Price, female    DOB: 1929/06/23,  MRN: [id number]  Sheila Price presents to clinic today for at risk foot care. Patient has h/o PAD. She is accompanied by her daughter on today's visit. Chief Complaint  Patient presents with   Nail Problem    Rfc,Referring Provider [name], MD,lov:07/24      Callouses    trim   New problem(s): None.   PCP is [name], MD.  No Known Allergies  Review of Systems: Negative except as noted in the HPI.  Objective: No changes noted in today's physical examination. There were no vitals filed for this visit. Sheila Price is a pleasant 89 y.o. female obese in NAD. AAO x 3.  Vascular Examination: CFT <3 seconds b/l. DP pulses faintly palpable b/l. PT pulses nonpalpable b/l. Digital hair absent. Skin temperature gradient warm to warm b/l. No pain with calf compression. No ischemia or gangrene. No cyanosis or clubbing noted b/l. +1 pitting edema noted BLE.   Neurological Examination: Sensation grossly intact b/l with 10 gram monofilament. Vibratory sensation intact b/l.   Dermatological Examination: Pedal skin warm and supple b/l. No open wounds b/l. No interdigital macerations. Toenails 1-5 b/l thick, discolored, elongated with subungual debris and pain on dorsal palpation.  No corns, calluses nor porokeratotic lesions noted.  Musculoskeletal Examination: Muscle strength 5/5 to all lower extremity muscle groups bilaterally. HAV with bunion deformity noted b/l LE. Hammertoe(s) noted to the left second digit.  Radiographs: None  Assessment/Plan: 1. Pain due to onychomycosis of toenails of both feet   2. PAD (peripheral artery disease) (HCC)    -Consent given for treatment as described below: -Examined patient. -Patient to continue soft, supportive shoe gear daily. -Mycotic toenails 1-5 bilaterally were debrided in length and girth with sterile nail nippers and dremel without incident. -Patient/POA  to call should there be question/concern in the interim.   Return in about 3 months (around 11/24/2022).  [name], DPM

## 2022-11-24 ENCOUNTER — Encounter: Payer: Self-pay | Admitting: Podiatry

## 2022-11-24 ENCOUNTER — Ambulatory Visit: Payer: Medicare Other | Admitting: Podiatry

## 2022-11-24 ENCOUNTER — Ambulatory Visit (INDEPENDENT_AMBULATORY_CARE_PROVIDER_SITE_OTHER): Payer: Medicare Other | Admitting: Podiatry

## 2022-11-24 DIAGNOSIS — B351 Tinea unguium: Secondary | ICD-10-CM | POA: Diagnosis not present

## 2022-11-24 DIAGNOSIS — M79674 Pain in right toe(s): Secondary | ICD-10-CM

## 2022-11-24 DIAGNOSIS — M79675 Pain in left toe(s): Secondary | ICD-10-CM

## 2022-11-24 DIAGNOSIS — I739 Peripheral vascular disease, unspecified: Secondary | ICD-10-CM

## 2022-11-28 ENCOUNTER — Encounter: Payer: Self-pay | Admitting: Podiatry

## 2022-11-28 NOTE — Progress Notes (Signed)
  Subjective:  Patient ID: Sheila Price, female    DOB: 07-Aug-1929,  MRN: [id number]  89 y.o. female presents with at risk foot care. Patient has h/o PAD and painful elongated mycotic toenails 1-5 bilaterally which are tender when wearing enclosed shoe gear. Pain is relieved with periodic professional debridement. She is accompanied by her daughter on today's visit. Chief Complaint  Patient presents with   RFC    NAIL CLIPPING      PCP: [name], MD.  New problem(s): None.   Review of Systems: Negative except as noted in the HPI.   No Known Allergies  Objective:  There were no vitals filed for this visit. Constitutional Patient is a pleasant 89 y.o. African American female in NAD. AAO x 3.  Vascular Capillary fill time to digits <3 seconds.  DP pulse(s) are faintly palpable b/l lower extremities. PT pulses absent b/l. Pedal hair absent b/l. Lower extremity skin temperature gradient warm to cool b/l. No pain with calf compression b/l. No cyanosis or clubbing noted. No ischemia nor gangrene noted b/l. +1 pitting edema noted BLE.  Neurologic Protective sensation intact 5/5 intact bilaterally with 10g monofilament b/l. Vibratory sensation intact b/l. No clonus b/l.   Dermatologic Pedal skin is thin, shiny and atrophic b/l.  No open wounds b/l lower extremities. No interdigital macerations b/l lower extremities. Toenails 1-5 b/l elongated, discolored, dystrophic, thickened, crumbly with subungual debris and tenderness to dorsal palpation. No hyperkeratotic nor porokeratotic lesions present on today's visit.  Orthopedic: Normal muscle strength 5/5 to all lower extremity muscle groups bilaterally. HAV with bunion deformity noted b/l LE. Hammertoe(s) noted to the L 2nd toe. Utilizes transport chair for ambulation assistance.   Last HgA1c:      No data to display         Assessment:   1. Pain due to onychomycosis of toenails of both feet   2. PAD (peripheral artery disease) (HCC)     Plan:  -Patient's family member present. All questions/concerns addressed on today's visit. -Continue supportive shoe gear daily. -Toenails 1-5 b/l were debrided in length and girth with sterile nail nippers and dremel without iatrogenic bleeding.  -Patient/POA to call should there be question/concern in the interim.  Return in about 3 months (around 02/24/2023).  [name], DPM

## 2023-02-23 ENCOUNTER — Encounter: Payer: Self-pay | Admitting: Podiatry

## 2023-02-23 ENCOUNTER — Ambulatory Visit: Payer: Medicare Other | Admitting: Podiatry

## 2023-02-23 ENCOUNTER — Ambulatory Visit (INDEPENDENT_AMBULATORY_CARE_PROVIDER_SITE_OTHER): Payer: Medicare Other | Admitting: Podiatry

## 2023-02-23 DIAGNOSIS — M79674 Pain in right toe(s): Secondary | ICD-10-CM

## 2023-02-23 DIAGNOSIS — I739 Peripheral vascular disease, unspecified: Secondary | ICD-10-CM

## 2023-02-23 DIAGNOSIS — M79675 Pain in left toe(s): Secondary | ICD-10-CM

## 2023-02-23 DIAGNOSIS — B351 Tinea unguium: Secondary | ICD-10-CM | POA: Diagnosis not present

## 2023-02-27 ENCOUNTER — Encounter: Payer: Self-pay | Admitting: Podiatry

## 2023-02-27 NOTE — Progress Notes (Signed)
  Subjective:  Patient ID: Sheila Price, female    DOB: [birth year],  MRN: [id number]  Sheila Price presents to clinic today for at risk foot care. Patient has h/o PAD and painful mycotic toenails x 10 which interfere with daily activities. Pain is relieved with periodic professional debridement. Her daughter is present during today's visit. Chief Complaint  Patient presents with   RFC    She is here fror a nail trim, PCP is Dr. Sharyn Lull,    New problem(s): None.   PCP is [name], MD.  No Known Allergies  Review of Systems: Negative except as noted in the HPI.  Objective: No changes noted in today's physical examination. There were no vitals filed for this visit. Sheila Price is a pleasant 89 y.o. female in NAD. AAO x 3.   Vascular Examination: CFT <3 seconds b/l. DP/PT pulses faintly palpable b/l. Skin temperature gradient warm to warm b/l. No pain with calf compression. No ischemia or gangrene. No cyanosis or clubbing noted b/l. +1 pitting edema noted BLE.   Neurological Examination: Sensation grossly intact b/l with 10 gram monofilament.   Dermatological Examination: Pedal skin warm and supple b/l.   No open wounds. No interdigital macerations.  Toenails 1-5 b/l thick, discolored, elongated with subungual debris and pain on dorsal palpation.    Pedal skin thin, shiny and atrophic b/l LE. No corns, calluses nor porokeratotic lesions noted.  Musculoskeletal Examination: Muscle strength 5/5 to all lower extremity muscle groups bilaterally. HAV with bunion bilaterally and hammertoes 2-5 b/l.  Radiographs: None  Last A1c:       No data to display         Assessment/Plan: 1. Pain due to onychomycosis of toenails of both feet   2. PAD (peripheral artery disease) (HCC)     -Consent given for treatment as described below: -Examined patient. -Patient to continue soft, supportive shoe gear daily. -Mycotic toenails 1-5 bilaterally were debrided in length and  girth with sterile nail nippers and dremel without incident. -Patient/POA to call should there be question/concern in the interim.   Return in about 3 months (around 05/24/2023).  [name], DPM      [facility] LOCATION: [address], [place] [address]                   Office [phone]   [facility] LOCATION: [address] [place], [place] [address] Office [phone]

## 2023-05-24 ENCOUNTER — Ambulatory Visit (INDEPENDENT_AMBULATORY_CARE_PROVIDER_SITE_OTHER): Payer: Medicare Other | Admitting: Podiatry

## 2023-05-24 ENCOUNTER — Ambulatory Visit: Payer: Medicare Other | Admitting: Podiatry

## 2023-05-24 ENCOUNTER — Encounter: Payer: Self-pay | Admitting: Podiatry

## 2023-05-24 DIAGNOSIS — B351 Tinea unguium: Secondary | ICD-10-CM

## 2023-05-24 DIAGNOSIS — M79675 Pain in left toe(s): Secondary | ICD-10-CM

## 2023-05-24 DIAGNOSIS — M79674 Pain in right toe(s): Secondary | ICD-10-CM

## 2023-05-24 DIAGNOSIS — I739 Peripheral vascular disease, unspecified: Secondary | ICD-10-CM

## 2023-05-30 NOTE — Progress Notes (Signed)
  Subjective:  Patient ID: Sheila Price, female    DOB: Nov 06, 1929,  MRN: [id number]  Sheila Price presents to clinic today for at risk foot care. Patient has h/o PAD and painful, discolored, thick toenails which interfere with daily activities. She is accompanied by her daughter on today's visit. Chief Complaint  Patient presents with   routine foot care    Rm 17 not diabetic/ Last visit with Dr. Glena Landau 1 month ago   New problem(s): None.   PCP is Chapman Commodore, MD.  No Known Allergies  Review of Systems: Negative except as noted in the HPI.  Objective: No changes noted in today's physical examination. There were no vitals filed for this visit. Sheila Price is a pleasant 89 y.o. female in NAD. AAO x 3.  Vascular Examination: CFT <3 seconds b/l. DP/PT pulses faintly palpable b/l. Skin temperature gradient warm to warm b/l. No pain with calf compression. No ischemia or gangrene. No cyanosis or clubbing noted b/l. Dependent edema noted b/l LE.   Neurological Examination: Sensation grossly intact b/l with 10 gram monofilament. Vibratory sensation intact b/l.   Dermatological Examination: Pedal skin warm and supple b/l.   No open wounds. No interdigital macerations.  Toenails 1-5 b/l thick, discolored, elongated with subungual debris and pain on dorsal palpation.    No hyperkeratotic nor porokeratotic lesions present on today's visit.  Musculoskeletal Examination: Muscle strength 5/5 to all lower extremity muscle groups bilaterally. HAV with bunion deformity noted b/l LE. Hammertoe deformity noted 2-5 b/l.  Radiographs: None  Assessment/Plan: 1. Pain due to onychomycosis of toenails of both feet   2. PAD (peripheral artery disease) (HCC)   Patient was evaluated and treated. All patient's and/or POA's questions/concerns addressed on today's visit. Toenails 1-5 debrided in length and girth without incident. Continue soft, supportive shoe gear daily. Report any pedal  injuries to medical professional. Call office if there are any questions/concerns. -Patient/POA to call should there be question/concern in the interim.   Return in about 3 months (around 08/23/2023).  Luella Sager, DPM      [place] LOCATION: [address], [place] [address]                   Office [phone]   [facility] LOCATION: [address] [place], [place] [address] Office [phone]

## 2023-08-24 ENCOUNTER — Ambulatory Visit (INDEPENDENT_AMBULATORY_CARE_PROVIDER_SITE_OTHER): Payer: Medicare Other | Admitting: Podiatry

## 2023-08-24 ENCOUNTER — Encounter: Payer: Self-pay | Admitting: Podiatry

## 2023-08-24 DIAGNOSIS — I739 Peripheral vascular disease, unspecified: Secondary | ICD-10-CM

## 2023-08-24 DIAGNOSIS — B351 Tinea unguium: Secondary | ICD-10-CM | POA: Diagnosis not present

## 2023-08-24 DIAGNOSIS — M79674 Pain in right toe(s): Secondary | ICD-10-CM

## 2023-08-24 DIAGNOSIS — M79675 Pain in left toe(s): Secondary | ICD-10-CM

## 2023-08-25 ENCOUNTER — Telehealth: Payer: Self-pay | Admitting: Podiatry

## 2023-08-25 NOTE — Telephone Encounter (Signed)
 Patient would like for you to call her at [phone].

## 2023-08-27 NOTE — Progress Notes (Signed)
  Subjective:  Patient ID: Sheila Price, female    DOB: 1929/06/09,  MRN: [id number]  Sheila Price presents to clinic today for at risk foot care. Patient has h/o PAD and painful thick toenails that are difficult to trim. Pain interferes with ambulation. Aggravating factors include wearing enclosed shoe gear. Pain is relieved with periodic professional debridement.  Chief Complaint  Patient presents with   RFC    RFC non diabetic toenail trim. LOV with PCP 08/02/23.   New problem(s): None.   PCP is Levern Hutching, MD.  No Known Allergies  Review of Systems: Negative except as noted in the HPI.  Objective: No changes noted in today's physical examination. There were no vitals filed for this visit. Sheila Price is a pleasant 89 y.o. female in NAD. AAO x 3.  Vascular Examination: CFT <3 seconds b/l. DP/PT pulses faintly palpable b/l. Digital hair absent b/l.  Skin temperature gradient warm to warm b/l. No pain with calf compression. No ischemia or gangrene. No cyanosis or clubbing noted b/l. Trace edema noted BLE.   Neurological Examination: Sensation grossly intact b/l with 10 gram monofilament. Vibratory sensation intact b/l.   Dermatological Examination: Pedal skin warm and supple b/l.   No open wounds. No interdigital macerations.  Toenails 1-5 b/l thick, discolored, elongated with subungual debris and pain on dorsal palpation.    No corns, calluses, nor porokeratotic lesions.  Musculoskeletal Examination: Normal muscle strength 5/5 to all lower extremity muscle groups bilaterally. HAV with bunion deformity noted b/l LE. Hammertoe(s) 2-5 b/l.SABRA No pain, crepitus or joint limitation noted with ROM b/l LE.  Patient ambulates independently without assistive aids.  Radiographs: None  Assessment/Plan: 1. Pain due to onychomycosis of toenails of both feet   2. PAD (peripheral artery disease) (HCC)     -Patient's family member present. All questions/concerns addressed on  today's visit. -Examined patient. -Patient to continue soft, supportive shoe gear daily. -Treatment was provided by assistant Andrez Manchester under my supervision. Mycotic toenails 1-5 bilaterally were debrided in length and girth with sterile nail nippers and dremel without incident. -Patient/POA to call should there be question/concern in the interim.   Return in about 3 months (around 11/24/2023).  Delon LITTIE Merlin, DPM      [place] LOCATION: [address] [place] [address]                   Office [phone]   [facility] LOCATION: [address] [place], [place] [address] Office [phone]

## 2023-11-23 ENCOUNTER — Ambulatory Visit: Admitting: Podiatry

## 2024-01-09 ENCOUNTER — Ambulatory Visit: Admitting: Podiatry

## 2024-01-09 ENCOUNTER — Encounter: Payer: Self-pay | Admitting: Podiatry

## 2024-01-09 DIAGNOSIS — B351 Tinea unguium: Secondary | ICD-10-CM

## 2024-01-09 DIAGNOSIS — I739 Peripheral vascular disease, unspecified: Secondary | ICD-10-CM

## 2024-01-09 DIAGNOSIS — M79675 Pain in left toe(s): Secondary | ICD-10-CM | POA: Diagnosis not present

## 2024-01-09 DIAGNOSIS — M79674 Pain in right toe(s): Secondary | ICD-10-CM

## 2024-01-15 NOTE — Progress Notes (Signed)
"  °  Subjective:  Patient ID: SIMRAH CHATHAM, female    DOB: [birth year],  MRN: [id number]  Maguadalupe G Keelan presents to clinic today for at risk foot care. Patient has h/o PAD and painful mycotic toenails of both feet that are difficult to trim. Pain interferes with daily activities and wearing enclosed shoe gear comfortably. Patient's daughter is present during today's visit. Chief Complaint  Patient presents with   Nail Problem    She denies being diabetic. She will see Dr. Levern in 2 weeks   New problem(s): None.   PCP is Levern Hutching, MD.  Allergies[1]  Review of Systems: Negative except as noted in the HPI.  Objective: No changes noted in today's physical examination. There were no vitals filed for this visit. SHIKIRA FOLINO is a pleasant 89 y.o. female in NAD. AAO x 3.  Vascular Examination: CFT <3 seconds b/l. DP/PT pulses faintly palpable b/l. Digital hair absent b/l.  Skin temperature gradient warm to warm b/l. No pain with calf compression. No ischemia or gangrene. No cyanosis or clubbing noted b/l. Trace edema noted BLE.   Neurological Examination: Sensation grossly intact b/l with 10 gram monofilament. Vibratory sensation intact b/l.   Dermatological Examination: Pedal skin warm and supple b/l.   No open wounds. No interdigital macerations.  Toenails 1-5 b/l thick, discolored, elongated with subungual debris and pain on dorsal palpation.    No corns, calluses, nor porokeratotic lesions.  Musculoskeletal Examination: Normal muscle strength 5/5 to all lower extremity muscle groups bilaterally. HAV with bunion deformity noted b/l LE. Hammertoe(s) 2-5 b/l.SABRA No pain, crepitus or joint limitation noted with ROM b/l LE.  Patient ambulates independently without assistive aids.  Radiographs: None  Assessment/Plan: 1. Pain due to onychomycosis of toenails of both feet   2. PAD (peripheral artery disease)   Consent given for treatment. Patient examined. All patient's and/or  POA's questions/concerns addressed on today's visit. Toenails 1-5 b/l debrided in length and girth without incident. Continue soft, supportive shoe gear daily. Report any pedal injuries to medical professional. Call office if there are any questions/concerns. -Patient/POA to call should there be question/concern in the interim.   Return in about 3 months (around 04/08/2024).  Delon LITTIE Merlin, DPM      [place] LOCATION: [address] [place] [address]                   Office [phone]   [facility] LOCATION: [address] [place], [place] [address] Office [phone]     [1] No Known Allergies  "

## 2024-02-28 ENCOUNTER — Ambulatory Visit (INDEPENDENT_AMBULATORY_CARE_PROVIDER_SITE_OTHER): Admitting: Podiatry

## 2024-02-28 DIAGNOSIS — Z91198 Patient's noncompliance with other medical treatment and regimen for other reason: Secondary | ICD-10-CM

## 2024-02-28 NOTE — Progress Notes (Signed)
 1. Failure to attend appointment with reason given    Rescheduled appointment due to weather.

## 2024-05-30 ENCOUNTER — Ambulatory Visit: Admitting: Podiatry
